# Patient Record
Sex: Female | Born: 1965 | Hispanic: Yes | Marital: Married | Smoking: Never smoker
Health system: Southern US, Community
[De-identification: ages and names within clinical notes are randomized; demographics above are authoritative.]

## PROBLEM LIST (undated history)

## (undated) DIAGNOSIS — Z923 Personal history of irradiation: Secondary | ICD-10-CM

## (undated) DIAGNOSIS — Z9221 Personal history of antineoplastic chemotherapy: Secondary | ICD-10-CM

## (undated) DIAGNOSIS — C50919 Malignant neoplasm of unspecified site of unspecified female breast: Secondary | ICD-10-CM

## (undated) DIAGNOSIS — C801 Malignant (primary) neoplasm, unspecified: Secondary | ICD-10-CM

## (undated) DIAGNOSIS — C50911 Malignant neoplasm of unspecified site of right female breast: Principal | ICD-10-CM

## (undated) DIAGNOSIS — S43005A Unspecified dislocation of left shoulder joint, initial encounter: Secondary | ICD-10-CM

## (undated) HISTORY — PX: BREAST LUMPECTOMY: SHX2

## (undated) HISTORY — DX: Malignant neoplasm of unspecified site of right female breast: C50.911

## (undated) HISTORY — DX: Malignant (primary) neoplasm, unspecified: C80.1

## (undated) HISTORY — DX: Unspecified dislocation of left shoulder joint, initial encounter: S43.005A

---

## 2008-01-08 DIAGNOSIS — Z9221 Personal history of antineoplastic chemotherapy: Secondary | ICD-10-CM

## 2008-01-08 DIAGNOSIS — Z923 Personal history of irradiation: Secondary | ICD-10-CM

## 2008-01-08 HISTORY — DX: Personal history of antineoplastic chemotherapy: Z92.21

## 2008-01-08 HISTORY — PX: BREAST SURGERY: SHX581

## 2008-01-08 HISTORY — DX: Personal history of irradiation: Z92.3

## 2008-01-15 DIAGNOSIS — C50911 Malignant neoplasm of unspecified site of right female breast: Secondary | ICD-10-CM

## 2008-01-15 HISTORY — DX: Malignant neoplasm of unspecified site of right female breast: C50.911

## 2008-06-10 ENCOUNTER — Ambulatory Visit: Payer: Self-pay | Admitting: Oncology

## 2008-06-13 LAB — CBC WITH DIFFERENTIAL/PLATELET
BASO%: 0.7 % (ref 0.0–2.0)
Basophils Absolute: 0 10*3/uL (ref 0.0–0.1)
EOS%: 4.4 % (ref 0.0–7.0)
HCT: 33.4 % — ABNORMAL LOW (ref 34.8–46.6)
HGB: 11.3 g/dL — ABNORMAL LOW (ref 11.6–15.9)
MCH: 28 pg (ref 25.1–34.0)
MCHC: 33.7 g/dL (ref 31.5–36.0)
MCV: 82.9 fL (ref 79.5–101.0)
MONO%: 6.1 % (ref 0.0–14.0)
NEUT%: 66.1 % (ref 38.4–76.8)
lymph#: 1.7 10*3/uL (ref 0.9–3.3)

## 2008-06-14 ENCOUNTER — Ambulatory Visit (HOSPITAL_COMMUNITY): Admission: RE | Admit: 2008-06-14 | Discharge: 2008-06-14 | Payer: Self-pay | Admitting: Oncology

## 2008-06-14 LAB — COMPREHENSIVE METABOLIC PANEL
Albumin: 4.4 g/dL (ref 3.5–5.2)
Alkaline Phosphatase: 59 U/L (ref 39–117)
BUN: 12 mg/dL (ref 6–23)
CO2: 23 mEq/L (ref 19–32)
Calcium: 9.5 mg/dL (ref 8.4–10.5)
Chloride: 107 mEq/L (ref 96–112)
Glucose, Bld: 100 mg/dL — ABNORMAL HIGH (ref 70–99)
Potassium: 3.8 mEq/L (ref 3.5–5.3)
Sodium: 140 mEq/L (ref 135–145)
Total Protein: 7.6 g/dL (ref 6.0–8.3)

## 2008-06-14 LAB — CANCER ANTIGEN 27.29: CA 27.29: 26 U/mL (ref 0–39)

## 2008-06-14 LAB — LACTATE DEHYDROGENASE: LDH: 138 U/L (ref 94–250)

## 2008-06-15 ENCOUNTER — Encounter: Admission: RE | Admit: 2008-06-15 | Discharge: 2008-06-15 | Payer: Self-pay | Admitting: Specialist

## 2008-06-28 LAB — CBC WITH DIFFERENTIAL/PLATELET
BASO%: 0.5 % (ref 0.0–2.0)
Basophils Absolute: 0 10*3/uL (ref 0.0–0.1)
EOS%: 4.1 % (ref 0.0–7.0)
HGB: 11.4 g/dL — ABNORMAL LOW (ref 11.6–15.9)
MCH: 29.1 pg (ref 25.1–34.0)
MCHC: 35.2 g/dL (ref 31.5–36.0)
MCV: 82.5 fL (ref 79.5–101.0)
MONO%: 8.7 % (ref 0.0–14.0)
RDW: 15.2 % — ABNORMAL HIGH (ref 11.2–14.5)

## 2008-06-29 ENCOUNTER — Ambulatory Visit (HOSPITAL_COMMUNITY): Admission: RE | Admit: 2008-06-29 | Discharge: 2008-06-29 | Payer: Self-pay | Admitting: Oncology

## 2008-07-07 LAB — CBC WITH DIFFERENTIAL/PLATELET
BASO%: 0.2 % (ref 0.0–2.0)
EOS%: 1.4 % (ref 0.0–7.0)
HCT: 36.4 % (ref 34.8–46.6)
MCH: 27.5 pg (ref 25.1–34.0)
MCHC: 33 g/dL (ref 31.5–36.0)
MONO#: 0.9 10*3/uL (ref 0.1–0.9)
NEUT%: 83.9 % — ABNORMAL HIGH (ref 38.4–76.8)
RBC: 4.36 10*6/uL (ref 3.70–5.45)
RDW: 15.2 % — ABNORMAL HIGH (ref 11.2–14.5)
WBC: 25.7 10*3/uL — ABNORMAL HIGH (ref 3.9–10.3)
lymph#: 2.8 10*3/uL (ref 0.9–3.3)

## 2008-07-07 LAB — COMPREHENSIVE METABOLIC PANEL
ALT: 49 U/L — ABNORMAL HIGH (ref 0–35)
AST: 37 U/L (ref 0–37)
CO2: 26 mEq/L (ref 19–32)
Calcium: 8.9 mg/dL (ref 8.4–10.5)
Chloride: 101 mEq/L (ref 96–112)
Creatinine, Ser: 0.54 mg/dL (ref 0.40–1.20)
Sodium: 136 mEq/L (ref 135–145)
Total Protein: 6.6 g/dL (ref 6.0–8.3)

## 2008-07-12 ENCOUNTER — Ambulatory Visit: Payer: Self-pay | Admitting: Oncology

## 2008-07-14 LAB — COMPREHENSIVE METABOLIC PANEL
AST: 57 U/L — ABNORMAL HIGH (ref 0–37)
Alkaline Phosphatase: 87 U/L (ref 39–117)
BUN: 10 mg/dL (ref 6–23)
Creatinine, Ser: 0.58 mg/dL (ref 0.40–1.20)

## 2008-07-14 LAB — CBC WITH DIFFERENTIAL/PLATELET
BASO%: 0.7 % (ref 0.0–2.0)
EOS%: 1.6 % (ref 0.0–7.0)
HCT: 31.2 % — ABNORMAL LOW (ref 34.8–46.6)
LYMPH%: 25 % (ref 14.0–49.7)
MCH: 27.4 pg (ref 25.1–34.0)
MCHC: 33.3 g/dL (ref 31.5–36.0)
NEUT%: 66.3 % (ref 38.4–76.8)
Platelets: 248 10*3/uL (ref 145–400)
RBC: 3.8 10*6/uL (ref 3.70–5.45)
lymph#: 2.1 10*3/uL (ref 0.9–3.3)
nRBC: 0 % (ref 0–0)

## 2008-07-21 LAB — CBC WITH DIFFERENTIAL/PLATELET
Basophils Absolute: 0 10*3/uL (ref 0.0–0.1)
Eosinophils Absolute: 0.1 10*3/uL (ref 0.0–0.5)
LYMPH%: 40.8 % (ref 14.0–49.7)
MCV: 84.2 fL (ref 79.5–101.0)
MONO%: 5.1 % (ref 0.0–14.0)
NEUT#: 2.5 10*3/uL (ref 1.5–6.5)
NEUT%: 51.9 % (ref 38.4–76.8)
Platelets: 309 10*3/uL (ref 145–400)
RBC: 3.8 10*6/uL (ref 3.70–5.45)

## 2008-07-21 LAB — COMPREHENSIVE METABOLIC PANEL
ALT: 65 U/L — ABNORMAL HIGH (ref 0–35)
Alkaline Phosphatase: 74 U/L (ref 39–117)
Glucose, Bld: 98 mg/dL (ref 70–99)
Sodium: 139 mEq/L (ref 135–145)
Total Bilirubin: 0.7 mg/dL (ref 0.3–1.2)
Total Protein: 7.1 g/dL (ref 6.0–8.3)

## 2008-07-28 LAB — CBC WITH DIFFERENTIAL/PLATELET
BASO%: 1 % (ref 0.0–2.0)
EOS%: 3.6 % (ref 0.0–7.0)
LYMPH%: 50.8 % — ABNORMAL HIGH (ref 14.0–49.7)
MCH: 27.9 pg (ref 25.1–34.0)
MCHC: 33 g/dL (ref 31.5–36.0)
MCV: 84.5 fL (ref 79.5–101.0)
MONO%: 5.3 % (ref 0.0–14.0)
Platelets: 484 10*3/uL — ABNORMAL HIGH (ref 145–400)
RBC: 3.8 10*6/uL (ref 3.70–5.45)
WBC: 4.2 10*3/uL (ref 3.9–10.3)
nRBC: 0 % (ref 0–0)

## 2008-08-03 LAB — COMPREHENSIVE METABOLIC PANEL
ALT: 48 U/L — ABNORMAL HIGH (ref 0–35)
AST: 35 U/L (ref 0–37)
Albumin: 4.1 g/dL (ref 3.5–5.2)
Alkaline Phosphatase: 60 U/L (ref 39–117)
Calcium: 9.2 mg/dL (ref 8.4–10.5)
Chloride: 106 mEq/L (ref 96–112)
Creatinine, Ser: 0.62 mg/dL (ref 0.40–1.20)
Potassium: 4.1 mEq/L (ref 3.5–5.3)

## 2008-08-03 LAB — CBC WITH DIFFERENTIAL/PLATELET
BASO%: 0.8 % (ref 0.0–2.0)
Basophils Absolute: 0 10e3/uL (ref 0.0–0.1)
EOS%: 0.7 % (ref 0.0–7.0)
Eosinophils Absolute: 0 10e3/uL (ref 0.0–0.5)
HCT: 31.9 % — ABNORMAL LOW (ref 34.8–46.6)
HGB: 11 g/dL — ABNORMAL LOW (ref 11.6–15.9)
LYMPH%: 41.1 % (ref 14.0–49.7)
MCH: 29.2 pg (ref 25.1–34.0)
MCHC: 34.5 g/dL (ref 31.5–36.0)
MCV: 84.7 fL (ref 79.5–101.0)
MONO#: 0.2 10e3/uL (ref 0.1–0.9)
MONO%: 4.7 % (ref 0.0–14.0)
NEUT#: 2.2 10e3/uL (ref 1.5–6.5)
NEUT%: 52.7 % (ref 38.4–76.8)
Platelets: 503 10e3/uL — ABNORMAL HIGH (ref 145–400)
RBC: 3.77 10e6/uL (ref 3.70–5.45)
RDW: 18 % — ABNORMAL HIGH (ref 11.2–14.5)
WBC: 4.3 10e3/uL (ref 3.9–10.3)
lymph#: 1.7 10e3/uL (ref 0.9–3.3)

## 2008-08-09 ENCOUNTER — Ambulatory Visit: Payer: Self-pay | Admitting: Oncology

## 2008-08-11 LAB — URINALYSIS, MICROSCOPIC - CHCC
Glucose: NEGATIVE g/dL
Nitrite: NEGATIVE
Specific Gravity, Urine: 1.025 (ref 1.003–1.035)

## 2008-08-11 LAB — COMPREHENSIVE METABOLIC PANEL
ALT: 62 U/L — ABNORMAL HIGH (ref 0–35)
Albumin: 4 g/dL (ref 3.5–5.2)
BUN: 7 mg/dL (ref 6–23)
CO2: 23 mEq/L (ref 19–32)
Calcium: 9.2 mg/dL (ref 8.4–10.5)
Chloride: 108 mEq/L (ref 96–112)
Creatinine, Ser: 0.59 mg/dL (ref 0.40–1.20)
Potassium: 3.7 mEq/L (ref 3.5–5.3)

## 2008-08-11 LAB — CBC WITH DIFFERENTIAL/PLATELET
BASO%: 0.7 % (ref 0.0–2.0)
Basophils Absolute: 0 10*3/uL (ref 0.0–0.1)
HCT: 32.4 % — ABNORMAL LOW (ref 34.8–46.6)
HGB: 10.9 g/dL — ABNORMAL LOW (ref 11.6–15.9)
MONO#: 0.4 10*3/uL (ref 0.1–0.9)
NEUT#: 2.1 10*3/uL (ref 1.5–6.5)
NEUT%: 46.4 % (ref 38.4–76.8)
WBC: 4.6 10*3/uL (ref 3.9–10.3)
lymph#: 2 10*3/uL (ref 0.9–3.3)

## 2008-08-14 ENCOUNTER — Emergency Department (HOSPITAL_COMMUNITY): Admission: EM | Admit: 2008-08-14 | Discharge: 2008-08-15 | Payer: Self-pay | Admitting: Emergency Medicine

## 2008-08-18 LAB — CBC WITH DIFFERENTIAL/PLATELET
Basophils Absolute: 0.1 10*3/uL (ref 0.0–0.1)
Eosinophils Absolute: 0.1 10*3/uL (ref 0.0–0.5)
HCT: 32 % — ABNORMAL LOW (ref 34.8–46.6)
HGB: 10.7 g/dL — ABNORMAL LOW (ref 11.6–15.9)
LYMPH%: 42.9 % (ref 14.0–49.7)
MCV: 86.3 fL (ref 79.5–101.0)
MONO#: 0.2 10*3/uL (ref 0.1–0.9)
MONO%: 5.3 % (ref 0.0–14.0)
NEUT#: 2.1 10*3/uL (ref 1.5–6.5)
NEUT%: 48.3 % (ref 38.4–76.8)
Platelets: 191 10*3/uL (ref 145–400)
WBC: 4.4 10*3/uL (ref 3.9–10.3)

## 2008-08-18 LAB — COMPREHENSIVE METABOLIC PANEL
Alkaline Phosphatase: 59 U/L (ref 39–117)
BUN: 10 mg/dL (ref 6–23)
Creatinine, Ser: 0.53 mg/dL (ref 0.40–1.20)
Glucose, Bld: 103 mg/dL — ABNORMAL HIGH (ref 70–99)
Sodium: 137 mEq/L (ref 135–145)
Total Bilirubin: 0.7 mg/dL (ref 0.3–1.2)
Total Protein: 7 g/dL (ref 6.0–8.3)

## 2008-08-25 LAB — CBC WITH DIFFERENTIAL/PLATELET
BASO%: 2.8 % — ABNORMAL HIGH (ref 0.0–2.0)
HCT: 31 % — ABNORMAL LOW (ref 34.8–46.6)
LYMPH%: 52.8 % — ABNORMAL HIGH (ref 14.0–49.7)
MCHC: 33.9 g/dL (ref 31.5–36.0)
MCV: 85.4 fL (ref 79.5–101.0)
MONO#: 0.3 10*3/uL (ref 0.1–0.9)
MONO%: 8.2 % (ref 0.0–14.0)
NEUT%: 35.3 % — ABNORMAL LOW (ref 38.4–76.8)
Platelets: 79 10*3/uL — ABNORMAL LOW (ref 145–400)
RBC: 3.63 10*6/uL — ABNORMAL LOW (ref 3.70–5.45)
nRBC: 0 % (ref 0–0)

## 2008-09-01 LAB — CBC WITH DIFFERENTIAL/PLATELET
BASO%: 0.7 % (ref 0.0–2.0)
EOS%: 2.2 % (ref 0.0–7.0)
HGB: 10.1 g/dL — ABNORMAL LOW (ref 11.6–15.9)
LYMPH%: 46 % (ref 14.0–49.7)
MCH: 29.5 pg (ref 25.1–34.0)
MCHC: 33.8 g/dL (ref 31.5–36.0)
MONO#: 0.6 10*3/uL (ref 0.1–0.9)
MONO%: 14.7 % — ABNORMAL HIGH (ref 0.0–14.0)
NEUT#: 1.5 10*3/uL (ref 1.5–6.5)
NEUT%: 36.4 % — ABNORMAL LOW (ref 38.4–76.8)
RBC: 3.42 10*6/uL — ABNORMAL LOW (ref 3.70–5.45)

## 2008-09-08 ENCOUNTER — Ambulatory Visit: Payer: Self-pay | Admitting: Oncology

## 2008-09-08 LAB — CBC WITH DIFFERENTIAL/PLATELET
BASO%: 0.5 % (ref 0.0–2.0)
HCT: 32 % — ABNORMAL LOW (ref 34.8–46.6)
LYMPH%: 40.2 % (ref 14.0–49.7)
MCHC: 33.1 g/dL (ref 31.5–36.0)
MCV: 89.4 fL (ref 79.5–101.0)
MONO#: 0.7 10*3/uL (ref 0.1–0.9)
MONO%: 17.3 % — ABNORMAL HIGH (ref 0.0–14.0)
NEUT%: 39.7 % (ref 38.4–76.8)
Platelets: 603 10*3/uL — ABNORMAL HIGH (ref 145–400)
RBC: 3.58 10*6/uL — ABNORMAL LOW (ref 3.70–5.45)
WBC: 4 10*3/uL (ref 3.9–10.3)

## 2008-09-09 LAB — COMPREHENSIVE METABOLIC PANEL
ALT: 97 U/L — ABNORMAL HIGH (ref 0–35)
AST: 69 U/L — ABNORMAL HIGH (ref 0–37)
Alkaline Phosphatase: 58 U/L (ref 39–117)
CO2: 26 mEq/L (ref 19–32)
Creatinine, Ser: 0.53 mg/dL (ref 0.40–1.20)
Sodium: 140 mEq/L (ref 135–145)
Total Bilirubin: 0.6 mg/dL (ref 0.3–1.2)
Total Protein: 7.1 g/dL (ref 6.0–8.3)

## 2008-09-15 LAB — CBC WITH DIFFERENTIAL/PLATELET
BASO%: 1.9 % (ref 0.0–2.0)
Basophils Absolute: 0.1 10*3/uL (ref 0.0–0.1)
EOS%: 1 % (ref 0.0–7.0)
HCT: 32.6 % — ABNORMAL LOW (ref 34.8–46.6)
HGB: 11.1 g/dL — ABNORMAL LOW (ref 11.6–15.9)
MCH: 30.2 pg (ref 25.1–34.0)
MCHC: 34 g/dL (ref 31.5–36.0)
MCV: 88.8 fL (ref 79.5–101.0)
MONO%: 17.1 % — ABNORMAL HIGH (ref 0.0–14.0)
NEUT%: 37.9 % — ABNORMAL LOW (ref 38.4–76.8)
RDW: 20.6 % — ABNORMAL HIGH (ref 11.2–14.5)

## 2008-09-15 LAB — COMPREHENSIVE METABOLIC PANEL
AST: 63 U/L — ABNORMAL HIGH (ref 0–37)
Alkaline Phosphatase: 79 U/L (ref 39–117)
BUN: 10 mg/dL (ref 6–23)
Creatinine, Ser: 0.49 mg/dL (ref 0.40–1.20)
Total Bilirubin: 0.5 mg/dL (ref 0.3–1.2)

## 2008-09-22 LAB — CBC WITH DIFFERENTIAL/PLATELET
Basophils Absolute: 0 10*3/uL (ref 0.0–0.1)
EOS%: 0.1 % (ref 0.0–7.0)
HCT: 31.9 % — ABNORMAL LOW (ref 34.8–46.6)
HGB: 10.9 g/dL — ABNORMAL LOW (ref 11.6–15.9)
LYMPH%: 7 % — ABNORMAL LOW (ref 14.0–49.7)
MCH: 31.2 pg (ref 25.1–34.0)
MCV: 91 fL (ref 79.5–101.0)
MONO%: 6.2 % (ref 0.0–14.0)
NEUT%: 86.6 % — ABNORMAL HIGH (ref 38.4–76.8)
Platelets: 61 10*3/uL — ABNORMAL LOW (ref 145–400)
lymph#: 2.1 10*3/uL (ref 0.9–3.3)

## 2008-09-22 LAB — COMPREHENSIVE METABOLIC PANEL
AST: 110 U/L — ABNORMAL HIGH (ref 0–37)
Alkaline Phosphatase: 244 U/L — ABNORMAL HIGH (ref 39–117)
BUN: 6 mg/dL (ref 6–23)
CO2: 26 mEq/L (ref 19–32)
Calcium: 9.3 mg/dL (ref 8.4–10.5)
Chloride: 105 mEq/L (ref 96–112)
Creatinine, Ser: 0.6 mg/dL (ref 0.40–1.20)

## 2008-09-26 ENCOUNTER — Ambulatory Visit (HOSPITAL_COMMUNITY): Admission: RE | Admit: 2008-09-26 | Discharge: 2008-09-26 | Payer: Self-pay | Admitting: Oncology

## 2008-09-27 LAB — COMPREHENSIVE METABOLIC PANEL
BUN: 8 mg/dL (ref 6–23)
CO2: 26 mEq/L (ref 19–32)
Calcium: 9.2 mg/dL (ref 8.4–10.5)
Chloride: 104 mEq/L (ref 96–112)
Creatinine, Ser: 0.76 mg/dL (ref 0.40–1.20)
Glucose, Bld: 143 mg/dL — ABNORMAL HIGH (ref 70–99)

## 2008-09-27 LAB — CBC WITH DIFFERENTIAL/PLATELET
Basophils Absolute: 0 10*3/uL (ref 0.0–0.1)
EOS%: 0.5 % (ref 0.0–7.0)
HCT: 30.3 % — ABNORMAL LOW (ref 34.8–46.6)
HGB: 10.4 g/dL — ABNORMAL LOW (ref 11.6–15.9)
MCH: 31.9 pg (ref 25.1–34.0)
MONO#: 0.8 10*3/uL (ref 0.1–0.9)
NEUT%: 77.5 % — ABNORMAL HIGH (ref 38.4–76.8)
lymph#: 1.3 10*3/uL (ref 0.9–3.3)

## 2008-10-27 ENCOUNTER — Encounter (INDEPENDENT_AMBULATORY_CARE_PROVIDER_SITE_OTHER): Payer: Self-pay | Admitting: Surgery

## 2008-10-27 ENCOUNTER — Ambulatory Visit: Payer: Self-pay | Admitting: Oncology

## 2008-10-27 ENCOUNTER — Ambulatory Visit (HOSPITAL_COMMUNITY): Admission: RE | Admit: 2008-10-27 | Discharge: 2008-10-27 | Payer: Self-pay | Admitting: Surgery

## 2008-11-15 ENCOUNTER — Ambulatory Visit: Admission: RE | Admit: 2008-11-15 | Discharge: 2009-01-06 | Payer: Self-pay | Admitting: Radiation Oncology

## 2008-11-17 LAB — CBC WITH DIFFERENTIAL/PLATELET
BASO%: 0.6 % (ref 0.0–2.0)
EOS%: 11.8 % — ABNORMAL HIGH (ref 0.0–7.0)
Eosinophils Absolute: 0.9 10*3/uL — ABNORMAL HIGH (ref 0.0–0.5)
LYMPH%: 27.4 % (ref 14.0–49.7)
MCH: 32.1 pg (ref 25.1–34.0)
MCHC: 33.7 g/dL (ref 31.5–36.0)
MCV: 95.4 fL (ref 79.5–101.0)
MONO%: 6.9 % (ref 0.0–14.0)
NEUT#: 4.2 10*3/uL (ref 1.5–6.5)
Platelets: 258 10*3/uL (ref 145–400)
RBC: 3.77 10*6/uL (ref 3.70–5.45)
RDW: 17 % — ABNORMAL HIGH (ref 11.2–14.5)

## 2008-11-17 LAB — PREGNANCY, URINE: Preg Test, Ur: NEGATIVE

## 2008-12-05 ENCOUNTER — Ambulatory Visit: Payer: Self-pay | Admitting: Oncology

## 2008-12-07 LAB — COMPREHENSIVE METABOLIC PANEL
AST: 31 U/L (ref 0–37)
Albumin: 4.1 g/dL (ref 3.5–5.2)
Alkaline Phosphatase: 83 U/L (ref 39–117)
BUN: 13 mg/dL (ref 6–23)
Calcium: 9.4 mg/dL (ref 8.4–10.5)
Chloride: 104 mEq/L (ref 96–112)
Glucose, Bld: 146 mg/dL — ABNORMAL HIGH (ref 70–99)
Potassium: 3.4 mEq/L — ABNORMAL LOW (ref 3.5–5.3)
Sodium: 139 mEq/L (ref 135–145)
Total Protein: 7.3 g/dL (ref 6.0–8.3)

## 2008-12-07 LAB — CBC WITH DIFFERENTIAL/PLATELET
Basophils Absolute: 0 10*3/uL (ref 0.0–0.1)
EOS%: 3.8 % (ref 0.0–7.0)
Eosinophils Absolute: 0.3 10*3/uL (ref 0.0–0.5)
HGB: 11.9 g/dL (ref 11.6–15.9)
MONO%: 6.2 % (ref 0.0–14.0)
NEUT#: 4.2 10*3/uL (ref 1.5–6.5)
RBC: 3.77 10*6/uL (ref 3.70–5.45)
RDW: 16.8 % — ABNORMAL HIGH (ref 11.2–14.5)
lymph#: 2.1 10*3/uL (ref 0.9–3.3)

## 2009-01-09 ENCOUNTER — Ambulatory Visit: Admission: RE | Admit: 2009-01-09 | Discharge: 2009-01-20 | Payer: Self-pay | Admitting: Radiation Oncology

## 2009-01-09 ENCOUNTER — Ambulatory Visit: Payer: Self-pay | Admitting: Oncology

## 2009-01-12 LAB — COMPREHENSIVE METABOLIC PANEL
ALT: 27 U/L (ref 0–35)
AST: 22 U/L (ref 0–37)
CO2: 22 mEq/L (ref 19–32)
Creatinine, Ser: 0.62 mg/dL (ref 0.40–1.20)
Glucose, Bld: 102 mg/dL — ABNORMAL HIGH (ref 70–99)
Potassium: 4 mEq/L (ref 3.5–5.3)
Sodium: 139 mEq/L (ref 135–145)
Total Protein: 7.1 g/dL (ref 6.0–8.3)

## 2009-01-12 LAB — CBC WITH DIFFERENTIAL/PLATELET
BASO%: 0.5 % (ref 0.0–2.0)
Basophils Absolute: 0 10*3/uL (ref 0.0–0.1)
Eosinophils Absolute: 0.3 10*3/uL (ref 0.0–0.5)
HCT: 37.3 % (ref 34.8–46.6)
HGB: 12.8 g/dL (ref 11.6–15.9)
LYMPH%: 14.4 % (ref 14.0–49.7)
MCV: 89.5 fL (ref 79.5–101.0)
RDW: 15 % — ABNORMAL HIGH (ref 11.2–14.5)

## 2009-01-12 LAB — VITAMIN D 25 HYDROXY (VIT D DEFICIENCY, FRACTURES): Vit D, 25-Hydroxy: 14 ng/mL — ABNORMAL LOW (ref 30–89)

## 2009-02-27 ENCOUNTER — Ambulatory Visit: Payer: Self-pay | Admitting: Oncology

## 2009-03-01 LAB — CBC WITH DIFFERENTIAL/PLATELET
Basophils Absolute: 0 10*3/uL (ref 0.0–0.1)
Eosinophils Absolute: 0.2 10*3/uL (ref 0.0–0.5)
HCT: 35.4 % (ref 34.8–46.6)
HGB: 12.2 g/dL (ref 11.6–15.9)
LYMPH%: 15.4 % (ref 14.0–49.7)
MONO#: 0.5 10*3/uL (ref 0.1–0.9)
NEUT#: 4.4 10*3/uL (ref 1.5–6.5)
NEUT%: 72.8 % (ref 38.4–76.8)
Platelets: 265 10*3/uL (ref 145–400)
WBC: 6.1 10*3/uL (ref 3.9–10.3)
lymph#: 0.9 10*3/uL (ref 0.9–3.3)

## 2009-03-01 LAB — COMPREHENSIVE METABOLIC PANEL
AST: 24 U/L (ref 0–37)
Albumin: 4.4 g/dL (ref 3.5–5.2)
Sodium: 140 mEq/L (ref 135–145)
Total Bilirubin: 1 mg/dL (ref 0.3–1.2)
Total Protein: 7.6 g/dL (ref 6.0–8.3)

## 2009-03-07 ENCOUNTER — Ambulatory Visit (HOSPITAL_COMMUNITY): Admission: RE | Admit: 2009-03-07 | Discharge: 2009-03-07 | Payer: Self-pay | Admitting: Oncology

## 2009-04-10 ENCOUNTER — Ambulatory Visit: Payer: Self-pay | Admitting: Oncology

## 2009-04-12 LAB — COMPREHENSIVE METABOLIC PANEL
ALT: 33 U/L (ref 0–35)
AST: 27 U/L (ref 0–37)
Albumin: 4.9 g/dL (ref 3.5–5.2)
BUN: 16 mg/dL (ref 6–23)
Creatinine, Ser: 0.71 mg/dL (ref 0.40–1.20)
Sodium: 137 mEq/L (ref 135–145)
Total Bilirubin: 1 mg/dL (ref 0.3–1.2)
Total Protein: 7.7 g/dL (ref 6.0–8.3)

## 2009-04-12 LAB — CBC WITH DIFFERENTIAL/PLATELET
Eosinophils Absolute: 0.2 10*3/uL (ref 0.0–0.5)
HCT: 39.4 % (ref 34.8–46.6)
LYMPH%: 21.2 % (ref 14.0–49.7)
MCHC: 34 g/dL (ref 31.5–36.0)
MONO%: 5.6 % (ref 0.0–14.0)
Platelets: 267 10*3/uL (ref 145–400)
RBC: 4.32 10*6/uL (ref 3.70–5.45)
RDW: 14.8 % — ABNORMAL HIGH (ref 11.2–14.5)
lymph#: 1.7 10*3/uL (ref 0.9–3.3)

## 2009-04-12 LAB — VITAMIN D 25 HYDROXY (VIT D DEFICIENCY, FRACTURES): Vit D, 25-Hydroxy: 19 ng/mL — ABNORMAL LOW (ref 30–89)

## 2009-05-03 ENCOUNTER — Ambulatory Visit (HOSPITAL_COMMUNITY): Admission: RE | Admit: 2009-05-03 | Discharge: 2009-05-03 | Payer: Self-pay | Admitting: Oncology

## 2009-05-03 LAB — COMPREHENSIVE METABOLIC PANEL
ALT: 27 U/L (ref 0–35)
BUN: 15 mg/dL (ref 6–23)
Chloride: 102 mEq/L (ref 96–112)
Potassium: 3.8 mEq/L (ref 3.5–5.3)
Sodium: 137 mEq/L (ref 135–145)
Total Bilirubin: 1.4 mg/dL — ABNORMAL HIGH (ref 0.3–1.2)
Total Protein: 7.3 g/dL (ref 6.0–8.3)

## 2009-05-03 LAB — VITAMIN D 25 HYDROXY (VIT D DEFICIENCY, FRACTURES): Vit D, 25-Hydroxy: 21 ng/mL — ABNORMAL LOW (ref 30–89)

## 2009-05-03 LAB — CBC WITH DIFFERENTIAL/PLATELET
EOS%: 2 % (ref 0.0–7.0)
HCT: 38.2 % (ref 34.8–46.6)
LYMPH%: 9.3 % — ABNORMAL LOW (ref 14.0–49.7)
MCH: 31.5 pg (ref 25.1–34.0)
MCHC: 34.7 g/dL (ref 31.5–36.0)
Platelets: 254 10*3/uL (ref 145–400)
RBC: 4.22 10*6/uL (ref 3.70–5.45)
WBC: 8 10*3/uL (ref 3.9–10.3)

## 2009-05-03 LAB — CANCER ANTIGEN 27.29: CA 27.29: 10 U/mL (ref 0–39)

## 2009-06-16 ENCOUNTER — Encounter: Admission: RE | Admit: 2009-06-16 | Discharge: 2009-06-16 | Payer: Self-pay | Admitting: Oncology

## 2009-10-13 ENCOUNTER — Ambulatory Visit: Payer: Self-pay | Admitting: Oncology

## 2009-10-17 LAB — COMPREHENSIVE METABOLIC PANEL
AST: 22 U/L (ref 0–37)
Albumin: 4.1 g/dL (ref 3.5–5.2)
Alkaline Phosphatase: 80 U/L (ref 39–117)
CO2: 32 mEq/L (ref 19–32)
Calcium: 9.1 mg/dL (ref 8.4–10.5)
Chloride: 106 mEq/L (ref 96–112)
Glucose, Bld: 104 mg/dL — ABNORMAL HIGH (ref 70–99)
Potassium: 3.5 mEq/L (ref 3.5–5.3)
Sodium: 142 mEq/L (ref 135–145)

## 2009-10-17 LAB — CBC
HCT: 36.7 % (ref 36.0–46.0)
Hemoglobin: 12.4 g/dL (ref 12.0–15.0)
MCHC: 33.7 g/dL (ref 30.0–36.0)
MCV: 89.7 fL (ref 78.0–100.0)
Platelets: 268 10*3/uL (ref 150–400)
RBC: 4.1 MIL/uL (ref 3.87–5.11)
WBC: 7.3 10*3/uL (ref 4.0–10.5)

## 2009-10-18 LAB — CANCER ANTIGEN 27.29: CA 27.29: 8 U/mL (ref 0–39)

## 2010-01-29 ENCOUNTER — Encounter: Payer: Self-pay | Admitting: Oncology

## 2010-04-12 LAB — CBC
HCT: 37.4 % (ref 36.0–46.0)
Hemoglobin: 12.7 g/dL (ref 12.0–15.0)
MCV: 97.2 fL (ref 78.0–100.0)
Platelets: 344 10*3/uL (ref 150–400)
RBC: 3.85 MIL/uL — ABNORMAL LOW (ref 3.87–5.11)
RDW: 22 % — ABNORMAL HIGH (ref 11.5–15.5)
WBC: 9.2 10*3/uL (ref 4.0–10.5)

## 2010-04-12 LAB — COMPREHENSIVE METABOLIC PANEL
ALT: 45 U/L — ABNORMAL HIGH (ref 0–35)
Alkaline Phosphatase: 103 U/L (ref 39–117)
Calcium: 9.8 mg/dL (ref 8.4–10.5)
GFR calc Af Amer: >60 mL/min (ref >60)
Glucose, Bld: 95 mg/dL (ref 70–99)
Potassium: 4.1 mEq/L (ref 3.5–5.1)
Sodium: 141 mEq/L (ref 135–145)
Total Bilirubin: 0.8 mg/dL (ref 0.3–1.2)

## 2010-04-12 LAB — DIFFERENTIAL
Basophils Relative: 1 % (ref 0–1)
Eosinophils Absolute: 0.3 10*3/uL (ref 0.0–0.7)
Eosinophils Relative: 3 % (ref 0–5)
Lymphocytes Relative: 23 % (ref 12–46)
Monocytes Relative: 9 % (ref 3–12)
Neutrophils Relative %: 64 % (ref 43–77)

## 2010-04-14 LAB — DIFFERENTIAL
Basophils Relative: 1 % (ref 0–1)
Eosinophils Absolute: 0 10*3/uL (ref 0.0–0.7)
Monocytes Absolute: 0.2 10*3/uL (ref 0.1–1.0)

## 2010-04-14 LAB — POCT I-STAT, CHEM 8
Creatinine, Ser: 0.5 mg/dL (ref 0.4–1.2)
HCT: 38 % (ref 36.0–46.0)
Hemoglobin: 12.9 g/dL (ref 12.0–15.0)
Potassium: 3.8 mEq/L (ref 3.5–5.1)
Sodium: 138 mEq/L (ref 135–145)

## 2010-04-14 LAB — CBC
HCT: 30.1 % — ABNORMAL LOW (ref 36.0–46.0)
Hemoglobin: 10.1 g/dL — ABNORMAL LOW (ref 12.0–15.0)
MCHC: 33.7 g/dL (ref 30.0–36.0)
Platelets: 229 10*3/uL (ref 150–400)

## 2010-05-23 ENCOUNTER — Other Ambulatory Visit: Payer: Self-pay | Admitting: Oncology

## 2010-05-23 DIAGNOSIS — Z9889 Other specified postprocedural states: Secondary | ICD-10-CM

## 2010-05-28 ENCOUNTER — Encounter (INDEPENDENT_AMBULATORY_CARE_PROVIDER_SITE_OTHER): Payer: Self-pay | Admitting: Surgery

## 2010-06-20 ENCOUNTER — Ambulatory Visit
Admission: RE | Admit: 2010-06-20 | Discharge: 2010-06-20 | Disposition: A | Discharge status: Home / Self Care | Payer: PRIVATE HEALTH INSURANCE | Source: Ambulatory Visit | Attending: Oncology | Admitting: Oncology

## 2010-06-20 DIAGNOSIS — Z9889 Other specified postprocedural states: Secondary | ICD-10-CM

## 2010-06-26 ENCOUNTER — Other Ambulatory Visit: Payer: Self-pay | Admitting: Oncology

## 2010-06-26 ENCOUNTER — Encounter (HOSPITAL_BASED_OUTPATIENT_CLINIC_OR_DEPARTMENT_OTHER): Payer: PRIVATE HEALTH INSURANCE | Admitting: Oncology

## 2010-06-26 DIAGNOSIS — R220 Localized swelling, mass and lump, head: Secondary | ICD-10-CM

## 2010-06-26 DIAGNOSIS — M899 Disorder of bone, unspecified: Secondary | ICD-10-CM

## 2010-06-26 DIAGNOSIS — C50319 Malignant neoplasm of lower-inner quadrant of unspecified female breast: Secondary | ICD-10-CM

## 2010-06-26 DIAGNOSIS — Z171 Estrogen receptor negative status [ER-]: Secondary | ICD-10-CM

## 2010-06-26 LAB — COMPREHENSIVE METABOLIC PANEL
Albumin: 4.5 g/dL (ref 3.5–5.2)
BUN: 15 mg/dL (ref 6–23)
CO2: 24 mEq/L (ref 19–32)
Calcium: 9 mg/dL (ref 8.4–10.5)
Chloride: 104 mEq/L (ref 96–112)
Glucose, Bld: 104 mg/dL — ABNORMAL HIGH (ref 70–99)
Potassium: 3.9 mEq/L (ref 3.5–5.3)
Sodium: 140 mEq/L (ref 135–145)
Total Protein: 7.3 g/dL (ref 6.0–8.3)

## 2010-06-26 LAB — CBC WITH DIFFERENTIAL/PLATELET
Basophils Absolute: 0 10*3/uL (ref 0.0–0.1)
Eosinophils Absolute: 0.3 10*3/uL (ref 0.0–0.5)
HGB: 12.5 g/dL (ref 11.6–15.9)
MCV: 88.7 fL (ref 79.5–101.0)
MONO#: 0.4 10*3/uL (ref 0.1–0.9)
NEUT#: 3.2 10*3/uL (ref 1.5–6.5)
RDW: 14.3 % (ref 11.2–14.5)
WBC: 5.7 10*3/uL (ref 3.9–10.3)
lymph#: 1.8 10*3/uL (ref 0.9–3.3)

## 2010-10-11 ENCOUNTER — Encounter (INDEPENDENT_AMBULATORY_CARE_PROVIDER_SITE_OTHER): Payer: Self-pay | Admitting: Surgery

## 2010-10-11 ENCOUNTER — Ambulatory Visit (INDEPENDENT_AMBULATORY_CARE_PROVIDER_SITE_OTHER): Payer: PRIVATE HEALTH INSURANCE | Admitting: Surgery

## 2010-10-11 VITALS — BP 98/64 | HR 60 | Temp 97.9°F | Resp 16 | Ht 61.0 in | Wt 150.4 lb

## 2010-10-11 DIAGNOSIS — Z853 Personal history of malignant neoplasm of breast: Secondary | ICD-10-CM

## 2010-10-11 NOTE — Progress Notes (Signed)
Subjective:     Patient ID: Karen Bullock, female   DOB: 10-06-1965, 45 y.o.   MRN: [id number]  HPI The patient returns today. She is 2 years out from a right breast lumpectomy and sentinel lymph node mapping for a stage II breast cancer. She had preop chemotherapy. She had postop radiation therapy. This was for a triple negative breast cancer   Review of Systems  Constitutional: Negative.   HENT: Negative.   Eyes: Negative.   Respiratory: Negative.   Cardiovascular: Negative.   Gastrointestinal: Negative.   Genitourinary: Negative.   Musculoskeletal: Negative.   Neurological: Negative.        Objective:   Physical Exam  Constitutional: She is oriented to person, place, and time. She appears well-developed and well-nourished.  HENT:  Head: Normocephalic and atraumatic.  Nose: Nose normal.  Eyes: Pupils are equal, round, and reactive to light.  Neck: Normal range of motion. Neck supple.  Pulmonary/Chest:       Right breast with post surgical changer.  No mass .  No axillary lymphadenopathy.  Left breast normal.  No left axillary lymphadenopathy.  Lymphadenopathy:    She has no cervical adenopathy.  Neurological: She is alert and oriented to person, place, and time.   Mammogram 05/2010   BIRADS 2    Assessment:     History of right breast cancer stage 2 triple negative status post lumpectomy and SLN mapping.      Plan:     Follow up 1 year.  Continue mammograms.  Continue self exams.

## 2010-10-11 NOTE — Patient Instructions (Signed)
Follow up 1 year.  Continue follow up with oncology and mammograms.

## 2010-10-16 ENCOUNTER — Encounter (INDEPENDENT_AMBULATORY_CARE_PROVIDER_SITE_OTHER): Payer: Self-pay | Admitting: Surgery

## 2010-12-08 ENCOUNTER — Telehealth: Payer: Self-pay | Admitting: Oncology

## 2010-12-08 NOTE — Telephone Encounter (Signed)
per pof 06/19 scheduled pt for appt in jan2013.  called caremanagement lmovm of appt and to have a spanish interpreter for pt and have the interpreter to call pt to see if the d/t is good for her schedule

## 2011-01-07 ENCOUNTER — Other Ambulatory Visit: Payer: Self-pay | Admitting: Physician Assistant

## 2011-01-15 ENCOUNTER — Other Ambulatory Visit: Payer: Self-pay | Admitting: Oncology

## 2011-01-15 ENCOUNTER — Telehealth: Payer: Self-pay | Admitting: Oncology

## 2011-01-15 ENCOUNTER — Encounter: Payer: Self-pay | Admitting: Physician Assistant

## 2011-01-15 ENCOUNTER — Ambulatory Visit (HOSPITAL_BASED_OUTPATIENT_CLINIC_OR_DEPARTMENT_OTHER): Payer: PRIVATE HEALTH INSURANCE | Admitting: Physician Assistant

## 2011-01-15 ENCOUNTER — Other Ambulatory Visit: Payer: PRIVATE HEALTH INSURANCE | Admitting: Lab

## 2011-01-15 VITALS — BP 107/73 | HR 71 | Temp 97.9°F | Ht 61.0 in | Wt 153.8 lb

## 2011-01-15 DIAGNOSIS — C50911 Malignant neoplasm of unspecified site of right female breast: Secondary | ICD-10-CM

## 2011-01-15 DIAGNOSIS — M899 Disorder of bone, unspecified: Secondary | ICD-10-CM

## 2011-01-15 DIAGNOSIS — R220 Localized swelling, mass and lump, head: Secondary | ICD-10-CM

## 2011-01-15 DIAGNOSIS — C50319 Malignant neoplasm of lower-inner quadrant of unspecified female breast: Secondary | ICD-10-CM

## 2011-01-15 LAB — CBC WITH DIFFERENTIAL/PLATELET
Basophils Absolute: 0 10*3/uL (ref 0.0–0.1)
EOS%: 3.1 % (ref 0.0–7.0)
HCT: 35.2 % (ref 34.8–46.6)
HGB: 12 g/dL (ref 11.6–15.9)
MONO#: 0.6 10*3/uL (ref 0.1–0.9)
NEUT#: 4.9 10*3/uL (ref 1.5–6.5)
NEUT%: 61.9 % (ref 38.4–76.8)
RDW: 14.3 % (ref 11.2–14.5)
WBC: 8 10*3/uL (ref 3.9–10.3)
lymph#: 2.2 10*3/uL (ref 0.9–3.3)

## 2011-01-15 LAB — COMPREHENSIVE METABOLIC PANEL
ALT: 22 U/L (ref 0–35)
AST: 18 U/L (ref 0–37)
Albumin: 3.9 g/dL (ref 3.5–5.2)
BUN: 13 mg/dL (ref 6–23)
CO2: 26 mEq/L (ref 19–32)
Calcium: 9.6 mg/dL (ref 8.4–10.5)
Chloride: 102 mEq/L (ref 96–112)
Creatinine, Ser: 0.64 mg/dL (ref 0.50–1.10)
Potassium: 3.8 mEq/L (ref 3.5–5.3)

## 2011-01-15 NOTE — Telephone Encounter (Signed)
Gv pt appt for sept-oct2013.  scheduled pt for mammogram in June2013 @ [facility].  scheduled pt for appt weith Dr. Ellin Mayhew for 02/01/2011 @ 2pm.  pt has interperter for all appts

## 2011-01-15 NOTE — Progress Notes (Signed)
Hematology and Oncology Follow Up Visit  Karen Bullock [id number] 1965-04-04 46 y.o. 01/15/2011    HPI: Karen Bullock noted a tender mass in her right breast in March. She thought this would go away on its own, but as it did not, she eventually brought it to the attention of Dr. Mayford Knife in [facility], and he treated her initially with Augmentin and ibuprofen, but with no shrinkage of the mass, referred her to [facility] where on 06/02/2008 the patient underwent ultrasound-guided biopsy and clip placement under Jeralyn Ruths.  The pathology from that procedure ([id number] and [id number]) showed a high-grade invasive ductal carcinoma, which was negative for both the estrogen and progesterone receptors at 0%, with a very elevated MIB-1 at 86% and negative for HER2-neu by CISH with a ratio of 0.97.    Karen Bullock was treated neoadjuvantly with carboplatin and docetaxel x1, with a reaction to the docetaxel; then Abraxane and carboplatin, which had to be discontinued because of peripheral neuropathy, now largely resolved; finally carboplatin and Gemzar.  All chemotherapy completed in September 2010, followed by right lumpectomy and sentinel lymph node sampling October 2010 with a complete pathologic response.  She completed radiation treatments January 2011.  Now followed with observation alone.    Interim History:   Karen Bullock returns today accompanied by her Spanish interpreter for routine followup of her right breast carcinoma. She is doing well. Interim history is unremarkable. She continues to work at a [employer] which she enjoys.  The only complaint Karen Bullock has today is some occasional postsurgical pain and tightness in the right breast and right axilla. She admits that she is not up-to-date with her health maintenance. She does need a routine physical as well as a Pap smear and would like a referral accordingly.  A detailed review of systems is otherwise noncontributory as noted  below.  Review of Systems: Constitutional:  no weight loss, fever, night sweats and feels well Eyes: negative [id number] Cardiovascular: no chest pain or dyspnea on exertion Respiratory: no cough, shortness of breath, or wheezing Neurological: no TIA or stroke symptoms negative Dermatological: negative Gastrointestinal: no abdominal pain, change in bowel habits, or black or bloody stools Genito-Urinary: no dysuria, trouble voiding, or hematuria Hematological and Lymphatic: negative Breast: positive for - mild occasional breast pain, right Musculoskeletal: negative Remaining ROS negative.  FAMILY HISTORY:  The patient's father is alive at age 91, the patient's mother is alive at age 70.  The patient has 2 brothers and 2 sisters.  There is no other breast or ovarian cancer in the family to her knowledge.  GYNECOLOGIC HISTORY:  She is GX, P3, first pregnancy age 56.  Last menstrual period started on 06/08/2008.  Her periods usually last about 3 days, of which only the first day is heavy.  Sometimes her periods have been up to a week late.  She has not experienced any hot flashes so far.  She uses condoms for contraception.  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  Her husband, Reeva Davern, works in Copywriter, advertising in a [employer].  They have been married 25 years.  Their son, [name], 27, lives in [place] and has 2 children.  Their daughter, [name], 54, lives here in town with the patient as does their son, Antony Odea, 5.  The patient is a Air traffic controller.  She is originally from the [place] area of [place].  The patient's father is alive at age 62, the patient's mother is alive at age 37.  The patient has 2 brothers and 2 sisters.  There is no other breast or ovarian cancer in the family to her knowledge.   Medications:   I have reviewed the patient's current medications.  Current Outpatient Prescriptions  Medication Sig Dispense Refill  . acetaminophen (TYLENOL) 325  MG tablet Take 650 mg by mouth every 6 (six) hours as needed.        Marland Kitchen ibuprofen (ADVIL,MOTRIN) 200 MG tablet Take 200 mg by mouth every 6 (six) hours as needed.         Allergies: No Known Allergies  Physical Exam: Filed Vitals:   01/15/11 1532  BP: 107/73  Pulse: 71  Temp: 97.9 F (36.6 C)   HEENT:  Sclerae anicteric, conjunctivae pink.  Oropharynx clear.  No mucositis or candidiasis.   Nodes:  No cervical, supraclavicular, or axillary lymphadenopathy palpated.  Breast Exam:  Right breast, status post lumpectomy, well-healed incision. No specialist nodularity, skin changes, or evidence of local recurrence. Left breast is benign, no masses, discharge, skin changes, or nipple inversion.   Lungs:  Clear to auscultation bilaterally.  No crackles, rhonchi, or wheezes.   Heart:  Regular rate and rhythm.   Abdomen:  Soft, nontender.  Positive bowel sounds.  No organomegaly or masses palpated.   Musculoskeletal:  No focal spinal tenderness to palpation.  Extremities:  Benign.  No peripheral edema or cyanosis.   Skin:  Benign.   Neuro:  Nonfocal.   Lab Results: Lab Results  Component Value Date   WBC 8.0 01/15/2011   HGB 12.0 01/15/2011   HCT 35.2 01/15/2011   MCV 88.7 01/15/2011   PLT 263 01/15/2011   NEUTROABS 4.9 01/15/2011     Chemistry      Component Value Date/Time   NA 140 06/26/2010 1105   NA 140 06/26/2010 1105   K 3.9 06/26/2010 1105   K 3.9 06/26/2010 1105   CL 104 06/26/2010 1105   CL 104 06/26/2010 1105   CO2 24 06/26/2010 1105   CO2 24 06/26/2010 1105   BUN 15 06/26/2010 1105   BUN 15 06/26/2010 1105   CREATININE 0.65 06/26/2010 1105   CREATININE 0.65 06/26/2010 1105      Component Value Date/Time   CALCIUM 9.0 06/26/2010 1105   CALCIUM 9.0 06/26/2010 1105   ALKPHOS 78 06/26/2010 1105   ALKPHOS 78 06/26/2010 1105   AST 20 06/26/2010 1105   AST 20 06/26/2010 1105   ALT 22 06/26/2010 1105   ALT 22 06/26/2010 1105   BILITOT 1.0 06/26/2010 1105   BILITOT 1.0 06/26/2010 1105         Radiological Studies:  06/20/2010 DIGITAL DIAGNOSTIC BILATERAL MAMMOGRAM WITH CAD  Comparison: 06/16/2009 a group of where of the forearm and  06/02/2008  Findings: Exam demonstrates heterogeneously dense fibroglandular  tissue. There are stable post lumpectomy changes of the inner  lower right breast. Remainder of the exam is unchanged.  Mammographic images were processed with CAD.  IMPRESSION:  Stable post lumpectomy changes of the right breast.  Recommendations: Recommend continued annual bilateral diagnostic  mammographic evaluation.  BI-RADS CATEGORY 2: Benign finding(s).   Assessment:  46 year old Spanish speaker status post right breast biopsy May 2010 for a triple-negative, high-grade invasive ductal carcinoma with a very elevated MIB-1, treated neoadjuvantly with carboplatin and docetaxel x1, with a reaction to the docetaxel; then Abraxane and carboplatin, which had to be discontinued because of peripheral neuropathy, now largely resolved; finally carboplatin and Gemzar.  All chemotherapy completed in September 2010,  followed by right lumpectomy and sentinel lymph node sampling October 2010 with a complete pathologic response.  She completed radiation treatments January 2011.     Plan:  Gerica is doing very well, no clinical evidence of disease recurrence. I will note that she was previously complaining of numbness and tingling was evaluated here last June, but fortunately that has completely resolved.  We will continue to follow her on a regular basis. She scheduled for her next mammogram in June, and return per Dr. Darnelle Catalan plan to see him in October 2013.  This plan was reviewed with the patient, who voices understanding and agreement.  She knows to call with any changes or problems.    Dalylah Ramey, PA-C 01/15/2011

## 2011-02-01 ENCOUNTER — Ambulatory Visit: Payer: PRIVATE HEALTH INSURANCE | Admitting: Family Medicine

## 2011-02-01 ENCOUNTER — Telehealth: Payer: Self-pay | Admitting: *Deleted

## 2011-02-01 NOTE — Telephone Encounter (Signed)
Patient has an appt today.  Will need to reschedule appt due to inclement weather.  Left message to call our office back.  Richardson, Jeannette Ann, RN  

## 2011-02-14 ENCOUNTER — Ambulatory Visit: Payer: PRIVATE HEALTH INSURANCE | Admitting: Family Medicine

## 2011-03-06 ENCOUNTER — Ambulatory Visit: Payer: PRIVATE HEALTH INSURANCE | Admitting: Family Medicine

## 2011-03-22 ENCOUNTER — Encounter: Payer: Self-pay | Admitting: *Deleted

## 2011-04-04 ENCOUNTER — Other Ambulatory Visit (HOSPITAL_COMMUNITY)
Admission: RE | Admit: 2011-04-04 | Discharge: 2011-04-04 | Disposition: A | Discharge status: Home / Self Care | Payer: PRIVATE HEALTH INSURANCE | Source: Ambulatory Visit | Attending: Family Medicine | Admitting: Family Medicine

## 2011-04-04 ENCOUNTER — Ambulatory Visit (INDEPENDENT_AMBULATORY_CARE_PROVIDER_SITE_OTHER): Payer: PRIVATE HEALTH INSURANCE | Admitting: Family Medicine

## 2011-04-04 ENCOUNTER — Encounter: Payer: Self-pay | Admitting: Family Medicine

## 2011-04-04 VITALS — BP 98/67 | HR 59 | Temp 97.7°F | Ht 61.0 in | Wt 152.0 lb

## 2011-04-04 DIAGNOSIS — Z01419 Encounter for gynecological examination (general) (routine) without abnormal findings: Secondary | ICD-10-CM | POA: Insufficient documentation

## 2011-04-04 DIAGNOSIS — Z23 Encounter for immunization: Secondary | ICD-10-CM

## 2011-04-04 DIAGNOSIS — Z113 Encounter for screening for infections with a predominantly sexual mode of transmission: Secondary | ICD-10-CM | POA: Insufficient documentation

## 2011-04-04 DIAGNOSIS — Z124 Encounter for screening for malignant neoplasm of cervix: Secondary | ICD-10-CM

## 2011-04-04 DIAGNOSIS — Z299 Encounter for prophylactic measures, unspecified: Secondary | ICD-10-CM | POA: Insufficient documentation

## 2011-04-04 DIAGNOSIS — Z202 Contact with and (suspected) exposure to infections with a predominantly sexual mode of transmission: Secondary | ICD-10-CM

## 2011-04-04 DIAGNOSIS — R5383 Other fatigue: Secondary | ICD-10-CM | POA: Insufficient documentation

## 2011-04-04 NOTE — Assessment & Plan Note (Addendum)
Pap done today. Pt to return fasting for lipid panel.  Advised to take calcium, Vit D.  Tdap today

## 2011-04-04 NOTE — Progress Notes (Signed)
  Subjective:    Patient ID: Karen Bullock, female    DOB: 1965/11/13, 46 y.o.   MRN: [id number]  HPI 46 yo here to establish care.  Her only concern is fatigue.  SHe sleeps 7-8 hours a night.  She recently completed treatment for breast cancer. SHe is due for a pap.  PMH/PSH/FH/SH/meds and allergies were updated.  Interview done with assistance of Spanish interpreter.      Review of Systems  No fevers, weight changes, SOB, CP, urinary c/o, depressed mood or dysparunia.  LMP 09/07/11.  +hot flushes.       Objective:   Physical Exam  Nursing note and vitals reviewed. Constitutional: She appears well-developed and well-nourished. No distress.  HENT:  Head: Normocephalic and atraumatic.  Right Ear: External ear normal.  Left Ear: External ear normal.  Nose: Nose normal.  Mouth/Throat: Oropharynx is clear and moist. No oropharyngeal exudate.  Eyes: Conjunctivae are normal. Right eye exhibits no discharge. Left eye exhibits no discharge. No scleral icterus.  Neck: Normal range of motion. Neck supple. No tracheal deviation present. No thyromegaly present.  Cardiovascular: Normal rate, regular rhythm and normal heart sounds.  Exam reveals no gallop and no friction rub.   No murmur heard. Pulmonary/Chest: Effort normal and breath sounds normal. No respiratory distress. She has no wheezes. She has no rales.  Abdominal: Soft. Bowel sounds are normal. She exhibits no distension and no mass. There is no tenderness. There is no rebound and no guarding.  Genitourinary:       Nl external female.  Nl vagina without atrophic changes.  Nl cervix with ectropion.  NO other lesions.  No vaginal or cervical discharge.  Uterus small, NT.  No adnexal mass or TTP.  Musculoskeletal: Normal range of motion. She exhibits no edema.  Lymphadenopathy:    She has no cervical adenopathy.  Neurological: She is alert. No cranial nerve deficit. She exhibits normal muscle tone.       Strength 5/5  Skin: She is not  diaphoretic.  Psychiatric: She has a normal mood and affect. Her behavior is normal. Judgment and thought content normal.          Assessment & Plan:

## 2011-04-04 NOTE — Patient Instructions (Signed)
It was nice to meet you today. Please let us know if there is anything you need from [place]. Please make an appointment some morning for a lab visit so we can check your cholesterol.  You should not have anything to eat or drink other than water or black coffee for 8 hours before that visit. I would suggest that you be sure to get enough calcium and vitamin D.  You should be sure to get 1200 mg of calcium a day, and 800 mg of vitamin D a day.  This is important for your bones. Fue Optometrist.Por favor, hganos saber si hay algo que necesita de nosotros.Por favor haga una cita una maana para una visita de laboratorio para que podamos comprobar sus niveles de colesterol. Usted no debera tener nada que comer ni beber ms que agua o caf negro durante 8 horas antes de la visita.Me permito sugerir que usted estar seguro de obtener suficiente calcio y vitamina D. Usted debe estar seguro de obtener 1200 mg de calcio al da, y 800 mg de vitamina D al da. Esto es importante para los huesos.

## 2011-04-04 NOTE — Assessment & Plan Note (Signed)
Pt reports adequate sleep.  Most recent CBC reviewed and no evidence of anemia.  Will check TSH

## 2011-04-08 ENCOUNTER — Encounter: Payer: Self-pay | Admitting: Family Medicine

## 2011-04-09 ENCOUNTER — Other Ambulatory Visit: Payer: PRIVATE HEALTH INSURANCE

## 2011-04-09 ENCOUNTER — Telehealth: Payer: Self-pay | Admitting: Family Medicine

## 2011-04-09 DIAGNOSIS — R5383 Other fatigue: Secondary | ICD-10-CM

## 2011-04-09 DIAGNOSIS — Z299 Encounter for prophylactic measures, unspecified: Secondary | ICD-10-CM

## 2011-04-09 LAB — LIPID PANEL
Cholesterol: 201 mg/dL — ABNORMAL HIGH (ref 0–200)
HDL: 37 mg/dL — ABNORMAL LOW (ref >39)

## 2011-04-09 LAB — TSH: TSH: 1.33 u[IU]/mL (ref 0.350–4.500)

## 2011-04-09 NOTE — Telephone Encounter (Signed)
Pt will like to know pap result. [place]

## 2011-04-09 NOTE — Progress Notes (Signed)
FLP AND TSH DONE TODAY Vearl Allbaugh 

## 2011-04-09 NOTE — Telephone Encounter (Signed)
It was normal.  She will get a letter with the results - it may take a few days because I just sent it yesterday.  She should feel free to call with any questions.

## 2011-04-10 ENCOUNTER — Telehealth: Payer: Self-pay | Admitting: Family Medicine

## 2011-04-10 NOTE — Telephone Encounter (Signed)
Thank You ° °Marines °

## 2011-04-16 ENCOUNTER — Encounter: Payer: Self-pay | Admitting: Family Medicine

## 2011-06-24 ENCOUNTER — Ambulatory Visit
Admission: RE | Admit: 2011-06-24 | Discharge: 2011-06-24 | Disposition: A | Discharge status: Home / Self Care | Payer: PRIVATE HEALTH INSURANCE | Source: Ambulatory Visit | Attending: Physician Assistant | Admitting: Physician Assistant

## 2011-06-24 DIAGNOSIS — C50911 Malignant neoplasm of unspecified site of right female breast: Secondary | ICD-10-CM

## 2011-06-24 NOTE — Progress Notes (Signed)
Interpreter Jheri Mitter Namihira for Mammogram  

## 2011-09-23 ENCOUNTER — Ambulatory Visit (INDEPENDENT_AMBULATORY_CARE_PROVIDER_SITE_OTHER): Payer: PRIVATE HEALTH INSURANCE | Admitting: Family Medicine

## 2011-09-23 ENCOUNTER — Encounter: Payer: Self-pay | Admitting: Family Medicine

## 2011-09-23 VITALS — BP 113/63 | HR 56 | Temp 97.9°F | Ht 65.0 in | Wt 152.3 lb

## 2011-09-23 DIAGNOSIS — L298 Other pruritus: Secondary | ICD-10-CM | POA: Insufficient documentation

## 2011-09-23 LAB — CBC WITH DIFFERENTIAL/PLATELET
HCT: 37.8 % (ref 36.0–46.0)
Hemoglobin: 12.7 g/dL (ref 12.0–15.0)
Lymphocytes Relative: 30 % (ref 12–46)
MCHC: 33.6 g/dL (ref 30.0–36.0)
MCV: 86.1 fL (ref 78.0–100.0)
Monocytes Absolute: 0.6 10*3/uL (ref 0.1–1.0)
Monocytes Relative: 8 % (ref 3–12)
Neutro Abs: 3.9 10*3/uL (ref 1.7–7.7)
WBC: 7.2 10*3/uL (ref 4.0–10.5)

## 2011-09-23 LAB — POCT SKIN KOH: Skin KOH, POC: NEGATIVE

## 2011-09-23 LAB — BASIC METABOLIC PANEL
BUN: 14 mg/dL (ref 6–23)
Chloride: 106 mEq/L (ref 96–112)
Glucose, Bld: 94 mg/dL (ref 70–99)
Potassium: 4.7 mEq/L (ref 3.5–5.3)

## 2011-09-23 LAB — POCT SEDIMENTATION RATE: POCT SED RATE: 20 mm/hr (ref 0–22)

## 2011-09-23 MED ORDER — HYDROCORTISONE 2.5 % EX OINT: TOPICAL_OINTMENT | Freq: Two times a day (BID) | CUTANEOUS | Status: AC

## 2011-09-23 NOTE — Progress Notes (Signed)
  Subjective:    Patient ID: Karen Bullock, female    DOB: [birth year], 46 y.o.   MRN: [id number]  HPI  1. Pruritic rash. Patient noted several small red, itchy bumps on her legs and arms. The first lesion started 10 days ago, about the size of a pencil-eraser then spread on the right shin to a flat, red, very itchy patch. Became centrally ulcerated and had some weeping few days ago. Now has a similar area on left inner shin. Has small papules on right bicep area. Tried an OTC topical medication for poison ivy, that made it worse. Denies any pain, bleeding, malaise, fever, fatigue, weight loss, sick contacts, insects, pets, tick exposure, new medication or lotions. She did do some yard work last weekend.   History of triple negative breast cancer underwent chemo and mastectomy 2010-11. She has f/u scheduled with oncologist next month.   Review of Systems See HPI otherwise negative.  reports that she has never smoked. She does not have any smokeless tobacco history on file.     Objective:   Physical Exam  Vitals reviewed. Constitutional: She is oriented to person, place, and time. She appears well-developed and well-nourished. No distress.  HENT:  Head: Normocephalic and atraumatic.  Eyes: EOM are normal.  Pulmonary/Chest: Effort normal.  Musculoskeletal: She exhibits no edema and no tenderness.  Neurological: She is alert and oriented to person, place, and time.  Skin: Rash noted. There is erythema.       Right anterior shin has erythematous macule/patch about 3.5 cm circumference, slight raised borders. Central scabbing noted about 1cm in circumference. No ulcer, weeping, abscess. Has a similar area on left lower leg above the medial malleolus that is smaller about 2 cm.  Right arm has few small erythematous papules on bicep area without any irritation noted. No vesicles/ulcers/tenderness.  Psychiatric: She has a normal mood and affect.       Assessment & Plan:

## 2011-09-23 NOTE — Assessment & Plan Note (Signed)
Atypical rash, spreading over 10 days. Perhaps is a contact dermatitis, but appearance is atypical possibly granuloma annulare vs fungal vs less likely malignancy. Biopsy is indicated given her history of breast cancer and uncertain diagnosis. Negative KOH in the office. Check CBC w/diff, ESR, BMET. Will treat empirically with topical hydrocortisone pending the result.     Punch Biopsy Procedure Note  Pre-operative Diagnosis: Suspicious lesion Post-operative Diagnosis: same Locations: right lower leg Indications: rule out malignancy Anesthesia: Lidocaine 1% with epinephrine  Procedure Details  History of allergy to iodine: no Patient informed of the risks (including bleeding and infection) and benefits of the  procedure and Written informed consent obtained.  The lesion and surrounding area was given a sterile prep using betadyne and draped in the usual sterile fashion. The skin was then stretched perpendicular to the skin tension lines and the lesion removed using the 4 mm punch. The resulting ellipse was then closed. Antibiotic ointment and a sterile dressing applied. The specimen was sent for pathologic examination. The patient tolerated the procedure well.  EBL: 0 ml Condition: Stable Complications: none, hemostasis with al cl. Plan: 1. Instructed to keep the wound dry and covered for 24-48h and clean thereafter. 2. Warning signs of infection were reviewed.   3. Recommended that the patient use OTC analgesics as needed for pain.

## 2011-09-23 NOTE — Patient Instructions (Addendum)
Nice to meet you. Pick up steroid ointment for now. Will call with results of biopsy.  Biopsia - Cuidados posteriores  (Biopsy, Care After)  Siga estas instrucciones durante las prximas semanas. Estas indicaciones le proporcionan informacin general acerca de cmo deber cuidarse despus del procedimiento. El mdico tambin podr darle instrucciones especficas. El tratamiento ha sido planificado segn las prcticas mdicas actuales, pero en algunos casos pueden ocurrir problemas. Comunquese con el mdico si tiene algn problema o tiene preguntas despus del procedimiento. Si le han realizado una biopsia con aguja fina, podrn sentir dolor en el sitio durante 1  2 das. Si le han realizado una biopsia abierta, podrn sentir dolor en el sitio durante 3  4 das.  INSTRUCCIONES PARA EL CUIDADO EN EL HOGAR   Puede reanudar su dieta y sus actividades normales segn se le haya indicado.   Cambie el vendaje tal como se le indic. Si la herida fue cerrada con pegamento para la piel Turner Daniels), comenzar a desprenderse luego de [address].   Solo tome medicamentos que se pueden comprar sin receta o recetados para Chief Technology Officer, Dentist o fiebre, como le indica el mdico.   Consulte a su mdico cundo puede baarse y English as a second language teacher la herida.  SOLICITE ATENCIN MDICA DE INMEDIATO SI:   Aumenta el sangrado (ms de una pequea mancha) en el lugar de la biopsia.   Presenta enrojecimiento, hinchazn o aumento del dolor en la herida.   Tiene pus en el sitio de la biopsia.   Tiene fiebre.   Advierte un olor ftido que proviene del sitio de la biopsia o del vendaje.   Presenta una erupcin cutnea, siente dificultad para respirar o tiene algn otro problema de alergia.  ASEGRESE DE QUE:   Comprende estas instrucciones.   Controlar su enfermedad.   Solicitar ayuda de inmediato si no mejora o si empeora.  Document Released: 08/28/2010 Document Revised: 12/13/2010 [facility] Patient Information 2012  [place], [place].

## 2011-10-01 ENCOUNTER — Other Ambulatory Visit (HOSPITAL_BASED_OUTPATIENT_CLINIC_OR_DEPARTMENT_OTHER): Payer: PRIVATE HEALTH INSURANCE | Admitting: Lab

## 2011-10-01 DIAGNOSIS — C50919 Malignant neoplasm of unspecified site of unspecified female breast: Secondary | ICD-10-CM

## 2011-10-01 DIAGNOSIS — C50911 Malignant neoplasm of unspecified site of right female breast: Secondary | ICD-10-CM

## 2011-10-01 LAB — COMPREHENSIVE METABOLIC PANEL (CC13)
Albumin: 3.8 g/dL (ref 3.5–5.0)
Alkaline Phosphatase: 76 U/L (ref 40–150)
CO2: 27 mEq/L (ref 22–29)
Glucose: 96 mg/dl (ref 70–99)
Potassium: 4.5 mEq/L (ref 3.5–5.1)
Sodium: 143 mEq/L (ref 136–145)
Total Protein: 7 g/dL (ref 6.4–8.3)

## 2011-10-01 LAB — CBC WITH DIFFERENTIAL/PLATELET
Basophils Absolute: 0.1 10*3/uL (ref 0.0–0.1)
EOS%: 6.1 % (ref 0.0–7.0)
Eosinophils Absolute: 0.4 10*3/uL (ref 0.0–0.5)
HGB: 11.6 g/dL (ref 11.6–15.9)
MONO#: 0.5 10*3/uL (ref 0.1–0.9)
NEUT#: 3.9 10*3/uL (ref 1.5–6.5)
RDW: 14.6 % — ABNORMAL HIGH (ref 11.2–14.5)
WBC: 7.1 10*3/uL (ref 3.9–10.3)
lymph#: 2.2 10*3/uL (ref 0.9–3.3)

## 2011-10-08 ENCOUNTER — Ambulatory Visit (HOSPITAL_BASED_OUTPATIENT_CLINIC_OR_DEPARTMENT_OTHER): Payer: PRIVATE HEALTH INSURANCE | Admitting: Oncology

## 2011-10-08 VITALS — BP 101/67 | HR 76 | Temp 98.5°F | Resp 20 | Ht 65.0 in | Wt 151.5 lb

## 2011-10-08 DIAGNOSIS — C50911 Malignant neoplasm of unspecified site of right female breast: Secondary | ICD-10-CM

## 2011-10-08 DIAGNOSIS — C50919 Malignant neoplasm of unspecified site of unspecified female breast: Secondary | ICD-10-CM

## 2011-10-08 DIAGNOSIS — Z171 Estrogen receptor negative status [ER-]: Secondary | ICD-10-CM

## 2011-10-08 NOTE — Progress Notes (Signed)
ID: Karen Bullock   DOB: [birth year]  MR#: [id number]  CSN#:[id number]  PCP: Gaspar Bidding, DO GYN:  SU: Harriette Bouillon MD OTHER MD:   HISTORY OF PRESENT ILLNESS: The patient tells me she noted a tender mass in her right breast in March. She thought this would go away on its own, but as it did not, she eventually brought it to the attention of Dr. Mayford Knife in [facility], and he treated her initially with Augmentin and ibuprofen, but with no shrinkage of the mass, referred her to [facility] where on 06/02/2008 the patient underwent ultrasound-guided biopsy and clip placement under Jeralyn Ruths.  The pathology from that procedure ([id number] and [id number]) showed a high-grade invasive ductal carcinoma, which was negative for both the estrogen and progesterone receptors at 0%, with a very elevated MIB-1 at 86% and negative for HER2-neu by CISH with a ratio of 0.97. Her subsequent history is as detailed below.  INTERVAL HISTORY: Karen Bullock returns with her sister for follow up of her breast cancer. The interval history is unremarkable. She continues to work in the [place]. Family is doing fine   REVIEW OF SYSTEMS: She had a rash develop in her right lower leg, which was biopsied. She was told it was an allergy. She treated with hydrocortisone cream and that has resolved. She is having some dental pain in her right jaw and she has scheduled appointment with a dentist to evaluate that. She was having more hot flashes than usual about 3 months ago but for some reason that has resolved otherwise a detailed review of systems is noncontributory  PAST MEDICAL HISTORY: Past Medical History  Diagnosis Date  . Breast cancer, right breast 01/15/2011  . Cancer     PAST SURGICAL HISTORY: Past Surgical History  Procedure Date  . Breast surgery 2010    lumpectomy    FAMILY HISTORY Family History  Problem Relation Age of Onset  . Diabetes Father   . Cancer Neg Hx   . Hypertension Neg  Hx   . Heart disease Neg Hx   The patient's father is alive at age 11, the patient's mother is alive at age 19.  The patient has 2 brothers and 2 sisters.  There is no other breast or ovarian cancer in the family to her knowledge.  GYNECOLOGIC HISTORY: She is GX, P3, first pregnancy age 41.  Her periods pretty much stopped with chemotherapy, although she had one July of 2013. The one before that was July of 2012.  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  Her husband, Janki Dike, works in Copywriter, advertising in a [employer].  They have been married 25+ years.  Their son, [name], lives in [place] and has 2 children.  Their daughter, [name], lives here in town with the patient as does her son, Lucio,7.  The patient is a Air traffic controller.  She is originally from the [place] area of [place].   ADVANCED DIRECTIVES:  HEALTH MAINTENANCE: History  Substance Use Topics  . Smoking status: Never Smoker   . Smokeless tobacco: Not on file  . Alcohol Use: No     Colonoscopy:  PAP:  Bone density:  Lipid panel:  No Known Allergies  Current Outpatient Prescriptions  Medication Sig Dispense Refill  . acetaminophen (TYLENOL) 325 MG tablet Take 650 mg by mouth every 6 (six) hours as needed.        . hydrocortisone 2.5 % ointment Apply topically 2 (two) times daily.  30  g  0  . ibuprofen (ADVIL,MOTRIN) 200 MG tablet Take 200 mg by mouth every 6 (six) hours as needed.         OBJECTIVE: Middle-aged Latin American woman who appears well Filed Vitals:   10/08/11 1351  BP: 101/67  Pulse: 76  Temp: 98.5 F (36.9 C)  Resp: 20     Body mass index is 25.21 kg/(m^2).    ECOG FS: 0  Sclerae unicteric Oropharynx clear No cervical or supraclavicular adenopathy Lungs no rales or rhonchi Heart regular rate and rhythm Abd benign MSK no focal spinal tenderness, no peripheral edema Neuro: nonfocal Breasts: The right breast is status post lumpectomy and radiation. There is no evidence  of local recurrence. The right axilla is benign. The left breast is unremarkable.  LAB RESULTS: Lab Results  Component Value Date   WBC 7.1 10/01/2011   NEUTROABS 3.9 10/01/2011   HGB 11.6 10/01/2011   HCT 34.1* 10/01/2011   MCV 88.6 10/01/2011   PLT 237 10/01/2011      Chemistry      Component Value Date/Time   NA 143 10/01/2011 1322   NA 141 09/23/2011 0935   K 4.5 10/01/2011 1322   K 4.7 09/23/2011 0935   CL 108* 10/01/2011 1322   CL 106 09/23/2011 0935   CO2 27 10/01/2011 1322   CO2 27 09/23/2011 0935   BUN 14.0 10/01/2011 1322   BUN 14 09/23/2011 0935   CREATININE 0.7 10/01/2011 1322   CREATININE 0.72 09/23/2011 0935   CREATININE 0.64 01/15/2011 1520      Component Value Date/Time   CALCIUM 9.7 10/01/2011 1322   CALCIUM 9.6 09/23/2011 0935   ALKPHOS 76 10/01/2011 1322   ALKPHOS 80 01/15/2011 1520   AST 16 10/01/2011 1322   AST 18 01/15/2011 1520   ALT 17 10/01/2011 1322   ALT 22 01/15/2011 1520   BILITOT 0.70 10/01/2011 1322   BILITOT 0.7 01/15/2011 1520       Lab Results  Component Value Date   LABCA2 8 10/01/2011    No components found with this basename: [id number]    No results found for this basename: INR:1;PROTIME:1 in the last 168 hours  Urinalysis    Component Value Date/Time   LABSPEC 1.025 08/11/2008 1008    STUDIES: No results found. Most recent mammogram June 2013 was benign  ASSESSMENT: 46 y.o. Spanish speaker status post right breast biopsy May 2010 for a triple-negative, high-grade invasive ductal carcinoma with a very elevated MIB-1, treated neoadjuvantly with carboplatin and docetaxel x1, with a reaction to the docetaxel; then Abraxane and carboplatin, which had to be discontinued because of peripheral neuropathy, now largely resolved; finally carboplatin and Gemzar.  All chemotherapy completed in September 2010, followed by right lumpectomy and sentinel lymph node sampling October 2010 with a complete pathologic response.  She completed radiation treatments January  2011  PLAN: Karen Bullock is doing well, with no evidence of recurrent disease. I gave her a copy of her lab work so she can show it to her dentist in case he has any questions regarding her counts. At this point I am comfortable seeing her on a once a year basis. She will have her next mammogram June of 2014 and see me again next October. She knows to call for any problems that may develop before the next visit.   Karen Bullock C    10/08/2011

## 2011-10-10 ENCOUNTER — Telehealth: Payer: Self-pay | Admitting: *Deleted

## 2011-10-10 NOTE — Telephone Encounter (Signed)
10-08-2012 10:00am lab only  10-15-2012 11:00am md

## 2012-05-25 ENCOUNTER — Other Ambulatory Visit: Payer: Self-pay | Admitting: Physician Assistant

## 2012-05-25 ENCOUNTER — Other Ambulatory Visit: Payer: Self-pay | Admitting: Oncology

## 2012-05-25 DIAGNOSIS — Z853 Personal history of malignant neoplasm of breast: Secondary | ICD-10-CM

## 2012-06-26 ENCOUNTER — Ambulatory Visit
Admission: RE | Admit: 2012-06-26 | Discharge: 2012-06-26 | Disposition: A | Discharge status: Home / Self Care | Payer: PRIVATE HEALTH INSURANCE | Source: Ambulatory Visit | Attending: Oncology | Admitting: Oncology

## 2012-06-26 DIAGNOSIS — Z853 Personal history of malignant neoplasm of breast: Secondary | ICD-10-CM

## 2012-10-08 ENCOUNTER — Other Ambulatory Visit (HOSPITAL_BASED_OUTPATIENT_CLINIC_OR_DEPARTMENT_OTHER): Payer: 59 | Admitting: Lab

## 2012-10-08 ENCOUNTER — Other Ambulatory Visit: Payer: Self-pay | Admitting: *Deleted

## 2012-10-08 DIAGNOSIS — C50911 Malignant neoplasm of unspecified site of right female breast: Secondary | ICD-10-CM

## 2012-10-08 DIAGNOSIS — C50319 Malignant neoplasm of lower-inner quadrant of unspecified female breast: Secondary | ICD-10-CM

## 2012-10-08 LAB — COMPREHENSIVE METABOLIC PANEL (CC13)
ALT: 17 U/L (ref 0–55)
AST: 16 U/L (ref 5–34)
BUN: 9.9 mg/dL (ref 7.0–26.0)
Calcium: 9.3 mg/dL (ref 8.4–10.4)
Chloride: 108 mEq/L (ref 98–109)
Creatinine: 0.7 mg/dL (ref 0.6–1.1)
Sodium: 143 mEq/L (ref 136–145)

## 2012-10-08 LAB — CBC WITH DIFFERENTIAL/PLATELET
BASO%: 1.2 % (ref 0.0–2.0)
Basophils Absolute: 0.1 10*3/uL (ref 0.0–0.1)
EOS%: 4.9 % (ref 0.0–7.0)
HCT: 38.6 % (ref 34.8–46.6)
HGB: 12.9 g/dL (ref 11.6–15.9)
LYMPH%: 32.7 % (ref 14.0–49.7)
MCH: 29.7 pg (ref 25.1–34.0)
MCV: 89 fL (ref 79.5–101.0)
MONO%: 6.8 % (ref 0.0–14.0)
NEUT%: 54.4 % (ref 38.4–76.8)
Platelets: 239 10*3/uL (ref 145–400)
RBC: 4.33 10*6/uL (ref 3.70–5.45)
WBC: 7 10*3/uL (ref 3.9–10.3)
lymph#: 2.3 10*3/uL (ref 0.9–3.3)

## 2012-10-15 ENCOUNTER — Ambulatory Visit (HOSPITAL_BASED_OUTPATIENT_CLINIC_OR_DEPARTMENT_OTHER): Payer: 59 | Admitting: Oncology

## 2012-10-15 ENCOUNTER — Telehealth: Payer: Self-pay | Admitting: Oncology

## 2012-10-15 VITALS — BP 102/65 | HR 61 | Temp 98.0°F | Resp 19 | Ht 65.0 in | Wt 151.3 lb

## 2012-10-15 DIAGNOSIS — C50911 Malignant neoplasm of unspecified site of right female breast: Secondary | ICD-10-CM

## 2012-10-15 DIAGNOSIS — C50311 Malignant neoplasm of lower-inner quadrant of right female breast: Secondary | ICD-10-CM

## 2012-10-15 DIAGNOSIS — Z853 Personal history of malignant neoplasm of breast: Secondary | ICD-10-CM

## 2012-10-15 NOTE — Progress Notes (Signed)
ID: Karen Bullock   DOB: 12-Aug-1965  MR#: [id number]  MWU#:[id number]  PCP: Karen Bidding, DO GYN:  SU: Karen Bouillon MD OTHER MD:   HISTORY OF PRESENT ILLNESS: The patient tells me she noted a tender mass in her right breast in March. She thought this would go away on its own, but as it did not, she eventually brought it to the attention of Dr. Mayford Bullock in [facility], and he treated her initially with Augmentin and ibuprofen, but with no shrinkage of the mass, referred her to [facility] where on 06/02/2008 the patient underwent ultrasound-guided biopsy and clip placement under Karen Bullock.  The pathology from that procedure ([id number] and [id number]) showed a high-grade invasive ductal carcinoma, which was negative for both the estrogen and progesterone receptors at 0%, with a very elevated MIB-1 at 86% and negative for HER2-neu by CISH with a ratio of 0.97. Her subsequent history is as detailed below.  INTERVAL HISTORY: Karen Bullock returns today for follow up of her breast cancer. The interval history is unremarkable. She continues to work in the [place]. She doesn't exercise outside of work, but her work is physical.  REVIEW OF SYSTEMS: She's had some itching and what she describes as a yellow spot develop over her left breast, and she wanted me to look at that. She has rare headaches, which are not severe. They're not accompanied by dizziness, nausea or vomiting, or any visual changes. A detailed review of systems today was otherwise entirely negative  PAST MEDICAL HISTORY: Past Medical History  Diagnosis Date  . Breast cancer, right breast 01/15/2011  . Cancer     PAST SURGICAL HISTORY: Past Surgical History  Procedure Laterality Date  . Breast surgery  2010    lumpectomy    FAMILY HISTORY Family History  Problem Relation Age of Onset  . Diabetes Father   . Cancer Neg Hx   . Hypertension Neg Hx   . Heart disease Neg Hx   The patient's father is alive at  age 37, the patient's mother is alive at age 63.  The patient has 2 brothers and 2 sisters.  There is no other breast or ovarian cancer in the family to her knowledge.  GYNECOLOGIC HISTORY: She is GX, P3, first pregnancy age 34.  Her periods pretty much stopped with chemotherapy, although she had a final one July of 2013. The one before that was July of 2012.  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  Her husband, Karen Bullock, works in Copywriter, advertising in a [employer].  They have been married 25+ years.  Their son, Karen Bullock, lives in [place] and has 2 children.  Their daughter, Karen Bullock, lives in town with the patient as does her son, Karen Bullock.  Karen Bullock works in a a [place] market as well. The patient is a Air traffic controller.  She is originally from the [place] area of [place].   ADVANCED DIRECTIVES:  HEALTH MAINTENANCE: History  Substance Use Topics  . Smoking status: Never Smoker   . Smokeless tobacco: Not on file  . Alcohol Use: No     Colonoscopy:  PAP:  Bone density:  Lipid panel:  No Known Allergies  Current Outpatient Prescriptions  Medication Sig Dispense Refill  . acetaminophen (TYLENOL) 325 MG tablet Take 650 mg by mouth every 6 (six) hours as needed.        Marland Kitchen ibuprofen (ADVIL,MOTRIN) 200 MG tablet Take 200 mg by mouth every 6 (six) hours as needed.  No current facility-administered medications for this visit.    OBJECTIVE: Middle-aged Latin American woman who appears well Filed Vitals:   10/15/12 1117  BP: 102/65  Pulse: 61  Temp: 98 F (36.7 C)  Resp: 19     Body mass index is 25.18 kg/(m^2).    ECOG FS: 0  Sclerae unicteric Oropharynx clear No cervical or supraclavicular adenopathy Lungs no rales or rhonchi Heart regular rate and rhythm Abd soft, nontender, positive bowel sounds MSK no focal spinal tenderness, no peripheral edema Neuro: nonfocal, well oriented, pleasant affect Breasts: The right breast is status post lumpectomy and  radiation. There is no evidence of local recurrence. The right axilla is benign. I do not find any yellowness or other "spot in the left breast. There is mild skin dryness for she has been scratching slightly. There is no excoriation. The left breast is otherwise entirely benign  LAB RESULTS: Lab Results  Component Value Date   WBC 7.0 10/08/2012   NEUTROABS 3.8 10/08/2012   HGB 12.9 10/08/2012   HCT 38.6 10/08/2012   MCV 89.0 10/08/2012   PLT 239 10/08/2012      Chemistry      Component Value Date/Time   NA 143 10/08/2012 1020   NA 141 09/23/2011 0935   K 4.3 10/08/2012 1020   K 4.7 09/23/2011 0935   CL 108* 10/01/2011 1322   CL 106 09/23/2011 0935   CO2 26 10/08/2012 1020   CO2 27 09/23/2011 0935   BUN 9.9 10/08/2012 1020   BUN 14 09/23/2011 0935   CREATININE 0.7 10/08/2012 1020   CREATININE 0.72 09/23/2011 0935   CREATININE 0.64 01/15/2011 1520      Component Value Date/Time   CALCIUM 9.3 10/08/2012 1020   CALCIUM 9.6 09/23/2011 0935   ALKPHOS 77 10/08/2012 1020   ALKPHOS 80 01/15/2011 1520   AST 16 10/08/2012 1020   AST 18 01/15/2011 1520   ALT 17 10/08/2012 1020   ALT 22 01/15/2011 1520   BILITOT 0.81 10/08/2012 1020   BILITOT 0.7 01/15/2011 1520       Lab Results  Component Value Date   LABCA2 8 10/01/2011    No components found with this basename: [id number]    No results found for this basename: INR,  in the last 168 hours  Urinalysis    Component Value Date/Time   LABSPEC 1.025 08/11/2008 1008    STUDIES: Mammography 06/26/2012 was unremarkable  ASSESSMENT: 47 y.o. 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  All chemotherapy  completed in September 2010,   (4) followed by right lumpectomy and sentinel lymph node sampling October 2010 with a complete pathologic response.    (5) She completed radiation treatments January 2011  PLAN: Karen Bullock is now 4 years out from her definitive surgery, with no evidence of disease recurrence. This is very favorable. I alerted her to our cervical cancer screening available here in early November and in [facility] in late October. Otherwise we will see her again in one year. We will consider releasing her from followup at that time.   Shaquina Gillham C    10/15/2012

## 2012-12-23 ENCOUNTER — Telehealth: Payer: Self-pay | Admitting: Family Medicine

## 2012-12-23 ENCOUNTER — Ambulatory Visit (INDEPENDENT_AMBULATORY_CARE_PROVIDER_SITE_OTHER): Payer: 59 | Admitting: Family Medicine

## 2012-12-23 ENCOUNTER — Encounter: Payer: Self-pay | Admitting: Family Medicine

## 2012-12-23 ENCOUNTER — Ambulatory Visit (HOSPITAL_COMMUNITY)
Admission: RE | Admit: 2012-12-23 | Discharge: 2012-12-23 | Disposition: A | Discharge status: Home / Self Care | Payer: 59 | Source: Ambulatory Visit | Attending: Family Medicine | Admitting: Family Medicine

## 2012-12-23 VITALS — BP 118/81 | HR 77 | Temp 98.7°F | Ht 65.0 in | Wt 144.0 lb

## 2012-12-23 DIAGNOSIS — R55 Syncope and collapse: Secondary | ICD-10-CM

## 2012-12-23 DIAGNOSIS — M79602 Pain in left arm: Secondary | ICD-10-CM

## 2012-12-23 DIAGNOSIS — W19XXXA Unspecified fall, initial encounter: Secondary | ICD-10-CM | POA: Insufficient documentation

## 2012-12-23 DIAGNOSIS — M6520 Calcific tendinitis, unspecified site: Secondary | ICD-10-CM | POA: Insufficient documentation

## 2012-12-23 DIAGNOSIS — M79609 Pain in unspecified limb: Secondary | ICD-10-CM

## 2012-12-23 DIAGNOSIS — M25529 Pain in unspecified elbow: Secondary | ICD-10-CM | POA: Insufficient documentation

## 2012-12-23 MED ORDER — NAPROXEN SODIUM 220 MG PO TABS: 220.0000 mg | ORAL_TABLET | Freq: Two times a day (BID) | ORAL | Status: DC

## 2012-12-23 MED ORDER — HYDROCODONE-ACETAMINOPHEN 5-325 MG PO TABS: 1.0000 | ORAL_TABLET | Freq: Three times a day (TID) | ORAL | Status: DC | PRN

## 2012-12-23 NOTE — Telephone Encounter (Signed)
Called to communicate xray results from today. Spoke with husband and described that there is no fracture or acute issue. 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 Informed patient's husband that they could follow up in 2-5 days if more convenient than tomorrow's scheduled visit, since there is no fracture. Husband voiced understanding.  Leona Singleton, MD

## 2012-12-23 NOTE — Progress Notes (Signed)
Patient ID: Karen Bullock, female   DOB: Jun 03, 1965, 47 y.o.   MRN: [id number] Subjective:   CC: Arm pain after fall  HPI:   Exam conducted in Spanish.  1. Left arm pain - Pt reports falling on left arm 4 days ago and hitting arm on table, hitting back of head. She denies neuro changes or syncope. She fell because she got mad, turned quickly in the kitchen, and tripped over table.  She then fell again after getting out of the shower yesterday and hit arm again. She thinks fall was due to dizziness and fainting. She says arm is so tender she refuses to even move it but is able to try.   2. Syncope - Pt reports "seeing stars" just prior to becoming dizzy and fainting yesterday. Just before "seeing stars," she reports arm was very painful when walking from shower to bedroom. Per her and husband, she was not eating or drinking enough in week leading up to yesterday, due to decreased appetite. She thinks she was out for just a few seconds and then felt too tired to get up so took a nap on the floor. Reports myalgias after waking up. She denies difficulty breathing or chest pain, history of syncope, and does not report abnormal body movements, neurologic sequelae, or sensation of room spinning. She denies difficulty speaking, walking, seeing, or hearing. She denies h/o anemia or recent illness. Denies new medications.  Review of Systems - Per HPI.  PMH: Reviewed H/o breast cancer   Objective:  Physical Exam BP 118/81  Pulse 77  Temp(Src) 98.7 F (37.1 C) (Oral)  Ht 5\' 5"  (1.651 m)  Wt 144 lb (65.318 kg)  BMI 23.96 kg/m2 GEN: NAD, pleasant CV: RRR, 2+ radial pulse bilaterally PULM: normal effort EXTR: No obvious deformity or swelling or erythema left arm, mild increased warmth left arm; Left arm extremely tender with palpation just above lateral and medial epicondyles, olecranon nontender, forearm nontender, shaft of humerus also exquisitely tender, left shoulder extremely tender. Allows  passive ROM of wrist, Normal sensation in fingers. Resists passive ROM of elbow or shoulder. Bilateral LE nonedematous, nonerythematous, nontender Neuro: CN 2-12 tested and intact, normal gait, normal speech, able to stand, though makes any movement hesitantly while holding left arm for fear of increasing pain.  Left arm imaging:  DG Shoulder L: IMPRESSION:  Calcific densities noted projecting in the course of the  supraspinatus tendon suggesting calcific supraspinatus tendonitis.  Calcified loose bodies in the glenohumeral joint could also present  in this fashion. Exam otherwise normal.  DG humerus left: IMPRESSION:  1. Calcific densities projected over the supraspinatus tendon as  described on left shoulder series.  2. Left humerus normal.  DG elbow complete left: IMPRESSION:  Negative.   Assessment:     Karen Bullock is a 47 y.o. female here for follow up of fall on left arm and subsequent syncope.    Plan:     # See problem list and after visit summary for problem-specific plans.   # Health Maintenance: Not discussed.  Follow-up: Follow up in 1-3 days for follow up of arm pain and syncope.   Leona Singleton, MD [facility]

## 2012-12-23 NOTE — Assessment & Plan Note (Signed)
Most likely orthostatic hypotension vs vasovagal given pain, as pt describes decreased PO this week due to decreased appetite, and "seeing stars" and fainting after significant arm pain when walking back to bedroom from shower. Exam and hx not consistent with PE or cardiogenic syncope. DDx still includes anemia, seizure, aortic stenosis, and neurocardiogenic.  - Return in 2-3 days for re-evaluation. - Will obtain orthostatics, CBC, EKG, and further neuro exam (romberg, FNF) on f/u. - Encouraged improved PO intake especially with liquids.

## 2012-12-23 NOTE — Patient Instructions (Signed)
Ir a [facility] por xray.  Ellos deben llamar nos con resultados. Regresa aqui en 2-3 dias para re-evaluacion.  Leona Singleton, MD

## 2012-12-23 NOTE — Assessment & Plan Note (Signed)
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 Pt likely has soft tissue bruising.  - Arm placed in sling for symptomatic management while sending to [facility] for xrays. - Vicodin PRN severe pain, aleve BID prescribed for daily use x 1 week - Return in 2-3 days for re-evaluation.

## 2012-12-24 ENCOUNTER — Emergency Department (INDEPENDENT_AMBULATORY_CARE_PROVIDER_SITE_OTHER)
Admission: EM | Admit: 2012-12-24 | Discharge: 2012-12-24 | Disposition: A | Discharge status: Home / Self Care | Payer: 59 | Source: Home / Self Care | Attending: Family Medicine | Admitting: Family Medicine

## 2012-12-24 ENCOUNTER — Emergency Department (INDEPENDENT_AMBULATORY_CARE_PROVIDER_SITE_OTHER): Payer: 59

## 2012-12-24 ENCOUNTER — Emergency Department (HOSPITAL_COMMUNITY): Payer: 59

## 2012-12-24 ENCOUNTER — Encounter (HOSPITAL_COMMUNITY): Payer: Self-pay | Admitting: Emergency Medicine

## 2012-12-24 ENCOUNTER — Ambulatory Visit (INDEPENDENT_AMBULATORY_CARE_PROVIDER_SITE_OTHER): Payer: 59 | Admitting: Sports Medicine

## 2012-12-24 ENCOUNTER — Encounter: Payer: Self-pay | Admitting: Sports Medicine

## 2012-12-24 VITALS — BP 100/61 | HR 63 | Temp 98.3°F | Ht 65.0 in | Wt 146.0 lb

## 2012-12-24 DIAGNOSIS — M79609 Pain in unspecified limb: Secondary | ICD-10-CM

## 2012-12-24 DIAGNOSIS — M79602 Pain in left arm: Secondary | ICD-10-CM

## 2012-12-24 DIAGNOSIS — S43006A Unspecified dislocation of unspecified shoulder joint, initial encounter: Secondary | ICD-10-CM

## 2012-12-24 DIAGNOSIS — S43005A Unspecified dislocation of left shoulder joint, initial encounter: Secondary | ICD-10-CM

## 2012-12-24 MED ORDER — HYDROMORPHONE HCL PF 1 MG/ML IJ SOLN
INTRAMUSCULAR | Status: AC
  Filled 2012-12-24: qty 1

## 2012-12-24 MED ORDER — KETOROLAC TROMETHAMINE 60 MG/2ML IM SOLN
60.0000 mg | Freq: Once | INTRAMUSCULAR | Status: AC
  Administered 2012-12-24: 60 mg via INTRAMUSCULAR

## 2012-12-24 MED ORDER — HYDROCODONE-ACETAMINOPHEN 5-325 MG PO TABS: 1.0000 | ORAL_TABLET | Freq: Three times a day (TID) | ORAL | Status: DC | PRN

## 2012-12-24 MED ORDER — HYDROMORPHONE HCL PF 1 MG/ML IJ SOLN
1.0000 mg | Freq: Once | INTRAMUSCULAR | Status: AC
  Administered 2012-12-24: 1 mg via INTRAMUSCULAR

## 2012-12-24 MED ORDER — KETOROLAC TROMETHAMINE 60 MG/2ML IM SOLN
INTRAMUSCULAR | Status: AC
  Filled 2012-12-24: qty 2

## 2012-12-24 NOTE — Patient Instructions (Signed)
   Go to the [facility] to see Dr. Denyse Amass    If you need anything prior to your next visit please call the clinic. Please Bring all medications or accurate medication list with you to each appointment; an accurate medication list is essential in providing you the best care possible.

## 2012-12-24 NOTE — Assessment & Plan Note (Addendum)
Clinically shoulder is anterior and exquisite pain with any motion (rom markedly limited).  No scapular Y view but appears to be dislocated.    Discussed with Dr. Clementeen Graham at [facility] urgent care regarding reimaging and attempted relocation with analgesics and glenohumeralanesthetic.  Patient aware of potential further intervention with sedation but trying to minimize cost.  > Follow up- likely needs to see orthopedics given likely rotator cuff involvement Secondary syncopal episode likely secondary to pain from dislocation.  Will need to be followed up if continues to have symptoms.

## 2012-12-24 NOTE — Progress Notes (Signed)
  Karen Bullock - 47 y.o. female MRN [id number]  Date of birth: May 11, 1965  CC, HPI, INTERVAL HISTORY & ROS  Karen Bullock is here today for follow up from a fall on her left side 5 days ago.    She reports her left arm continues to hurt her significantly.  She has not been able to move her arm at all.  She is unable to shower.  X-rays were obtained yesterday that showed no acute fracture but no scapular Y. view was obtained and unable to assess for dislocation.  History  Past Medical, Surgical, Social, and Family History Reviewed per EMR Medications and Allergies reviewed and all updated if necessary. Objective Findings  VITALS: HR: 63 bpm  BP: 100/61 mmHg  TEMP: 98.3 F (36.8 C) (Oral)  RESP:    HT: 5\' 5"  (165.1 cm)  WT: 146 lb (66.225 kg)  BMI: 24.3   BP Readings from Last 3 Encounters:  12/24/12 100/61  12/23/12 118/81  10/15/12 102/65   Wt Readings from Last 3 Encounters:  12/24/12 146 lb (66.225 kg)  12/23/12 144 lb (65.318 kg)  10/15/12 151 lb 4.8 oz (68.629 kg)     PHYSICAL EXAM: GENERAL: Hispanic adult  female. In no discomfort; no respiratory distress  PSYCH: alert and appropriate, good insight   HNEENT:   CARDIO:   LUNGS:   ABDOMEN:   EXTREM:  Sensation grossly intact but exquisitely painful throughout, capillary refill less than 2 seconds, pulses 2+ out of 4.  Left humerus is sitting anterior and inferior with anterior prominence  GU:   SKIN:     Assessment & Plan   Problems addressed today: General Plan & Pt Instructions:  No diagnosis found.    Go to the [facility] to see Dr. Denyse Amass      For further discussion of A/P and for follow up issues see problem based charting if applicable.

## 2012-12-24 NOTE — ED Notes (Signed)
Pt  Fell  4  Days  Ago       Decreased  rom   l  Arm    With      Pain

## 2012-12-24 NOTE — ED Provider Notes (Signed)
Karen Bullock is a 47 y.o. female who presents to Urgent Care today for left shoulder pain. Patient fell landing on her outstretched left shoulder 4 days ago. She had moderate pain however 2 days ago she fell again in the shower. Since then she's had severe shoulder pain worse with motion. She was evaluated the family practice Center yesterday and had a relatively normal shoulder and humerus x-ray. However she returned with a practice Center this morning where a primary care for his medicine physician was concerned that she has a shoulder dislocation.  The patient denies any radiating pain weakness.  She does note some numbness along the lateral deltoid region. She has been taking naproxen and hydrocodone which did help some. She has been wearing a sling.   Upon my personal review of the x-ray findings from December 17 the humerus appears to be inferior on the scapular Y. Film.   Past Medical History  Diagnosis Date  . Breast cancer, right breast 01/15/2011  . Cancer    History  Substance Use Topics  . Smoking status: Never Smoker   . Smokeless tobacco: Not on file  . Alcohol Use: No   ROS as above Medications reviewed. No current facility-administered medications for this encounter.   Current Outpatient Prescriptions  Medication Sig Dispense Refill  . acetaminophen (TYLENOL) 325 MG tablet Take 650 mg by mouth every 6 (six) hours as needed.        Marland Kitchen HYDROcodone-acetaminophen (NORCO/VICODIN) 5-325 MG per tablet Take 1 tablet by mouth every 8 (eight) hours as needed for moderate pain.  10 tablet  0  . ibuprofen (ADVIL,MOTRIN) 200 MG tablet Take 200 mg by mouth every 6 (six) hours as needed.       . naproxen sodium (ALEVE) 220 MG tablet Take 1 tablet (220 mg total) by mouth 2 (two) times daily with a meal.  30 tablet  0    Exam:  BP 119/81  Pulse 59  Temp(Src) 97.6 F (36.4 C) (Oral)  Resp 12  SpO2 99% Gen: Well NAD LEFT SHOULDER: No sulcus visible however slightly abnormal and tender.  Significant shoulder pain and lack of range of motion. Capillary refill and sensation are intact distally. Patient has very mild numbness lateral deltoid.   Grip strength is intact distally..    Reduction of left shoulder dislocation:   Patient was given 1 mg of IM allotted. The arm is abducted to about 120. Patient was experiencing too much pain and procedure was discontinued.   Patient was then given one more milligram of IM allotted and 60 mg of IM Toradol. After 20 minutes patient had significant improvement in pain. Axillary traction with abduction to 170 internal rotation and then adduction achieved abduction of the shoulder dislocation.  Patient felt much better following the reduction.   Followup x-rays were performed without placing patient in abduction.  She was placed into a shoulder immobilizer.   No results found for this or any previous visit (from the past 24 hour(s)). Dg Elbow Complete Left  12/23/2012   CLINICAL DATA:  Fall.  Pain.  EXAM: LEFT ELBOW - COMPLETE 3+ VIEW  COMPARISON:  None.  FINDINGS: There is no evidence of fracture, dislocation, or joint effusion. There is no evidence of arthropathy or other focal bone abnormality. Soft tissues are unremarkable.  IMPRESSION: Negative.   Electronically Signed   By: Maisie Fus  Register   On: 12/23/2012 12:39   Dg Shoulder Left  12/24/2012   CLINICAL DATA:  Left shoulder injury.  EXAM: LEFT SHOULDER - 2+ VIEW  COMPARISON:  Same date  FINDINGS: Visualized ribs appear normal. No fracture dislocation is noted. No significant degenerative changes noted. Calcific densities are again noted over the humeral head concerning for calcific tendonitis.  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 No acute abnormality seen in left shoulder.   Electronically Signed   By: Roque Lias M.D.   On: 12/24/2012 12:40   Dg Shoulder Left  12/23/2012   CLINICAL DATA:  Fall.  Pain.  EXAM: LEFT SHOULDER - 2+ VIEW  COMPARISON:  None.   FINDINGS: Calcific densities noted projected over the course of the supraspinatus tendon consistent with calcific supraspinatus tendinitis. Loose bodies within the glenohumeral joint space could also present in this fashion. No evidence of fracture or dislocation.  IMPRESSION: Calcific densities noted projecting in the course of the supraspinatus tendon suggesting calcific supraspinatus tendonitis. Calcified loose bodies in the glenohumeral joint could also present in this fashion. Exam otherwise normal.   Electronically Signed   By: Maisie Fus  Register   On: 12/23/2012 12:41   Dg Humerus Left  12/23/2012   CLINICAL DATA:  Fall.  Pain.  EXAM: LEFT HUMERUS - 2+ VIEW  COMPARISON:  None.  FINDINGS: Calcific densities noted projected in the region of the supraspinatus tendon as described on left shoulder series. Left humerus appears normal. No evidence of fracture.  IMPRESSION: 1. Calcific densities projected over the supraspinatus tendon as described on left shoulder series. 2. Left humerus normal.   Electronically Signed   By: Maisie Fus  Register   On: 12/23/2012 12:42    Assessment and Plan: 47 y.o. female with status post reduction of left shoulder dislocation. Patient was placed into a shoulder immobilizer given more hydrocodone and instructed to followup with Dr. Berline Chough. Discussed warning signs or symptoms. Please see discharge instructions. Patient expresses understanding.      Rodolph Bong, MD 12/24/12 1250

## 2012-12-28 ENCOUNTER — Encounter: Payer: Self-pay | Admitting: Family Medicine

## 2012-12-28 ENCOUNTER — Ambulatory Visit (INDEPENDENT_AMBULATORY_CARE_PROVIDER_SITE_OTHER): Payer: 59 | Admitting: Family Medicine

## 2012-12-28 VITALS — BP 107/65 | HR 53 | Temp 98.1°F | Ht 65.0 in | Wt 151.5 lb

## 2012-12-28 DIAGNOSIS — M79602 Pain in left arm: Secondary | ICD-10-CM

## 2012-12-28 DIAGNOSIS — M79609 Pain in unspecified limb: Secondary | ICD-10-CM

## 2012-12-28 NOTE — Patient Instructions (Signed)
Continua reposo del hombro por 2 semanas mas. Usalo incluso para dormir. Has una cita de [facility] en 2 semanas.

## 2012-12-28 NOTE — Progress Notes (Signed)
Family Medicine Office Visit Note   Subjective:   Patient ID: Karen Bullock, female  DOB: 08-25-1965, 47 y.o.. MRN: [id number]   Pt that comes today to f/u her shoulder dislocation after trauma. Shoulder was reduced on the 12/18th at Urgent Care and pt was placed in a shoulder rest device and instructed to f/u in 5 days for possible removing shoulder brace. She reports her pain is controlled and is not taking any medication for it.   Review of Systems:  Per HPI. Denies fever, chills, nausea, vomiting, headaches, dizziness, numbness or focalized weakness.   Objective:   Physical Exam: Gen:  NAD HEENT: Moist mucous membranes  CV: Regular rate and rhythm, no murmurs PULM: Clear to auscultation bilaterally.  Neuro: Alert and oriented x3. No focalization MSK: Left shoulder: no deformity, edema or erythema. Full ROM passive and active. Neurovascular intact.   Assessment & Plan:

## 2012-12-29 NOTE — Assessment & Plan Note (Signed)
Discussed with attending Dr. Jennette Kettle. Pt symptoms (pain has improved). Instructed to continue with arm rest for 2 weeks and f[facility]u [facility].  Discussed signs of worsening condition that should prompt re-evaluation.

## 2013-01-13 ENCOUNTER — Encounter: Payer: Self-pay | Admitting: Sports Medicine

## 2013-01-13 ENCOUNTER — Ambulatory Visit (INDEPENDENT_AMBULATORY_CARE_PROVIDER_SITE_OTHER): Payer: 59 | Admitting: Sports Medicine

## 2013-01-13 VITALS — BP 103/70 | Ht 65.0 in | Wt 148.0 lb

## 2013-01-13 DIAGNOSIS — M25512 Pain in left shoulder: Secondary | ICD-10-CM

## 2013-01-13 DIAGNOSIS — M25519 Pain in unspecified shoulder: Secondary | ICD-10-CM

## 2013-01-13 NOTE — Progress Notes (Signed)
   Subjective:    Patient ID: Karen Bullock, female    DOB: [birth year], 48 y.o.   MRN: [id number]  HPI chief complaint: Left shoulder  Right-hand-dominant Hispanic female comes in today complaining of 1 month of left shoulder pain. She initially injured herself after falling on an outstretched left arm. Immediate onset of shoulder pain. Was seen in the emergency room on December 17 and x-rays showed no obvious fracture or dislocation. Patient was discharged home but returned to urgent care the following day. Dr. Georgina Snell reviewed her x-rays and felt like she had a shoulder dislocation. Per the urgent care notes a successful reduction was performed. The patient herself says she had immediate pain relief with manipulation. Since that time she has been in a sling and taking naproxen sodium. She continues to have limited mobility of the arm as well as lateral shoulder pain. She describes weakness as well. She denies any problems with the shoulder prior to her injury. No associated numbness or tingling. No neck pain. No prior shoulder surgery. Entire history is obtained through an interpreter.  Past medical history is reviewed Current medications are reviewed Medications are reviewed Allergies are reviewed Social history reviewed    Review of Systems     Objective:   Physical Exam Well-developed, well-nourished. No acute distress. Awake alert and oriented x3.  Left shoulder: Limited active motion in all planes secondary to pain. Active forward flexion and abduction to 50. I am able to passively carry her to 90. No soft tissue swelling. No tenderness to palpation along the clavicle or over the a.c. joint. There is global left upper extremity weakness secondary to pain. Positive Hawkins, positive empty can. Positive O'Brien's. Positive apprehension. Neurovascularly intact distally.  X-rays from December 17 and December 18 are reviewed. None of the films including the scapular Y. films demonstrate any  obvious shoulder dislocation or fracture. There is what appears to be an intra-articular loose body in the glenohumeral joint. MSK ultrasound of the left shoulder was performed. Limited images of the supraspinatus tendon shows the majority of the tendon to appear to be intact. There is a hypoechoic area in the inferior portion of the tendon which may represent a tear here versus anisotropy.       Assessment & Plan:  Left shoulder pain-question recent shoulder dislocation  None of the x-rays show me an obvious shoulder dislocation. Nonetheless, in reviewing the patient's history and Dr. Clovis Riley urgent care notes, it certainly does sound as though she may have suffered a dislocation at the time of her fall. If that is indeed the case she has a high chance of having suffered a significant rotator cuff tear at the time of her injury. Even though the ultrasound does not show an obvious tear I think we should pursue an MRI scan to evaluate this further. She will followup with me one to 2 days after that study. If she does in fact have a full-thickness rotator cuff tear then we will consider surgical referral. In the meantime, she is to discontinue her sling and start pendulum exercises. She will discontinue her naproxen sodium as well.

## 2013-01-21 ENCOUNTER — Inpatient Hospital Stay: Admission: RE | Admit: 2013-01-21 | Payer: 59 | Source: Ambulatory Visit

## 2013-02-01 ENCOUNTER — Ambulatory Visit: Payer: 59 | Admitting: Sports Medicine

## 2013-02-03 ENCOUNTER — Ambulatory Visit
Admission: RE | Admit: 2013-02-03 | Discharge: 2013-02-03 | Disposition: A | Discharge status: Home / Self Care | Payer: 59 | Source: Ambulatory Visit | Attending: Sports Medicine | Admitting: Sports Medicine

## 2013-02-03 DIAGNOSIS — M25512 Pain in left shoulder: Secondary | ICD-10-CM

## 2013-02-09 ENCOUNTER — Ambulatory Visit: Payer: 59 | Admitting: Sports Medicine

## 2013-02-12 ENCOUNTER — Telehealth: Payer: Self-pay | Admitting: Sports Medicine

## 2013-02-12 NOTE — Telephone Encounter (Signed)
Patient failed to keep her appointment to discuss MRI findings of her left shoulder. The MRI does not show a rotator cuff tear. We will try to reschedule her to discuss results face-to-face.

## 2013-02-25 ENCOUNTER — Ambulatory Visit: Payer: 59 | Admitting: Sports Medicine

## 2013-03-05 ENCOUNTER — Ambulatory Visit: Payer: 59 | Admitting: Sports Medicine

## 2013-03-15 ENCOUNTER — Ambulatory Visit (INDEPENDENT_AMBULATORY_CARE_PROVIDER_SITE_OTHER): Payer: 59 | Admitting: Sports Medicine

## 2013-03-15 ENCOUNTER — Encounter: Payer: Self-pay | Admitting: Sports Medicine

## 2013-03-15 VITALS — BP 103/67 | HR 65 | Ht 65.0 in | Wt 148.0 lb

## 2013-03-15 DIAGNOSIS — M25512 Pain in left shoulder: Secondary | ICD-10-CM

## 2013-03-15 DIAGNOSIS — M25519 Pain in unspecified shoulder: Secondary | ICD-10-CM

## 2013-03-15 NOTE — Progress Notes (Signed)
   Subjective:    Patient ID: Karen Bullock, female    DOB: Jun 10, 1965, 48 y.o.   MRN: [id number]  HPI Patient comes in today to discuss MRI findings of the left shoulder. She has a history of a questionable left shoulder dislocation which occurred several weeks ago. MRI was performed to rule out a full-thickness rotator cuff tear. Since her last office visit, her pain has improved on naproxen and Tylenol. She is regaining mobility. Main complaint is popping in the shoulder from time to time which she states feels like the "bone is popping". She is here today with her husband. Entire history is obtained with the help of an interpreter.    Review of Systems     Objective:   Physical Exam Well-developed, well-nourished. No acute distress. Awake alert and oriented x3. Passive range of motion of the left shoulder is full. Active forward flexion is to about 90. Active abduction 80. Nearly full internal and external rotation. Rotator cuff strength is 5/5. Positive empty can, positive Hawkins. Negative apprehension. Neurovascularly intact distally.  MRI of the left shoulder done 02/03/2013 is reviewed. No evidence of full-thickness rotator cuff tear. There is some rotator cuff tendinopathy as well as some subacromial bursitis. Nothing acute is seen.       Assessment & Plan:  Improving left shoulder pain status post questionable dislocation with MRI evidence of rotator cuff tendinopathy and subacromial bursitis  I reviewed the patient's options with her today. Options include continuing to take a watchful waiting approach, physical therapy, and subacromial cortisone injection. At this point in time, she would like to continue with a watchful waiting approach. She understands that if her symptoms persist that we could consider a subacromial cortisone injection down the road. She is reassured that I see no acute findings in her shoulder. The popping she is experiencing is due to the subacromial  bursitis. She may continue on both the naproxen and Tylenol as needed and will followup with me when necessary.

## 2013-05-28 ENCOUNTER — Other Ambulatory Visit: Payer: Self-pay | Admitting: Oncology

## 2013-05-28 DIAGNOSIS — Z853 Personal history of malignant neoplasm of breast: Secondary | ICD-10-CM

## 2013-06-28 ENCOUNTER — Ambulatory Visit
Admission: RE | Admit: 2013-06-28 | Discharge: 2013-06-28 | Disposition: A | Discharge status: Home / Self Care | Payer: PRIVATE HEALTH INSURANCE | Source: Ambulatory Visit | Attending: Oncology | Admitting: Oncology

## 2013-06-28 DIAGNOSIS — Z853 Personal history of malignant neoplasm of breast: Secondary | ICD-10-CM

## 2013-10-14 ENCOUNTER — Ambulatory Visit: Payer: PRIVATE HEALTH INSURANCE | Admitting: Oncology

## 2013-10-14 ENCOUNTER — Other Ambulatory Visit: Payer: PRIVATE HEALTH INSURANCE

## 2013-10-15 ENCOUNTER — Telehealth: Payer: Self-pay | Admitting: *Deleted

## 2013-10-15 NOTE — Telephone Encounter (Signed)
This RN spoke with Karen Bullock - spanish interpreter- per her discussion with pt inquiring " when is my appointment ?" Karen Bullock states pt also has complaints of overall body aches and bone pain.  Per review noted pt's had appointment yesterday.  Appointment rescheduled for later this month - informed Karen Bullock- pt's concerns will be reviewed with her at visit.

## 2013-10-27 ENCOUNTER — Other Ambulatory Visit: Payer: Self-pay | Admitting: *Deleted

## 2013-10-27 DIAGNOSIS — C50311 Malignant neoplasm of lower-inner quadrant of right female breast: Secondary | ICD-10-CM

## 2013-10-28 ENCOUNTER — Ambulatory Visit (HOSPITAL_BASED_OUTPATIENT_CLINIC_OR_DEPARTMENT_OTHER): Payer: PRIVATE HEALTH INSURANCE | Admitting: Oncology

## 2013-10-28 ENCOUNTER — Other Ambulatory Visit (HOSPITAL_BASED_OUTPATIENT_CLINIC_OR_DEPARTMENT_OTHER): Payer: PRIVATE HEALTH INSURANCE

## 2013-10-28 DIAGNOSIS — Z853 Personal history of malignant neoplasm of breast: Secondary | ICD-10-CM

## 2013-10-28 DIAGNOSIS — C50311 Malignant neoplasm of lower-inner quadrant of right female breast: Secondary | ICD-10-CM

## 2013-10-28 LAB — CBC WITH DIFFERENTIAL/PLATELET
BASO%: 0.7 % (ref 0.0–2.0)
Basophils Absolute: 0.1 10*3/uL (ref 0.0–0.1)
EOS%: 3.5 % (ref 0.0–7.0)
Eosinophils Absolute: 0.3 10*3/uL (ref 0.0–0.5)
HEMATOCRIT: 38.4 % (ref 34.8–46.6)
HGB: 12.6 g/dL (ref 11.6–15.9)
LYMPH#: 2.2 10*3/uL (ref 0.9–3.3)
LYMPH%: 27.9 % (ref 14.0–49.7)
MCH: 28.9 pg (ref 25.1–34.0)
MCHC: 32.7 g/dL (ref 31.5–36.0)
MCV: 88.5 fL (ref 79.5–101.0)
MONO#: 0.5 10*3/uL (ref 0.1–0.9)
MONO%: 6 % (ref 0.0–14.0)
NEUT#: 4.9 10*3/uL (ref 1.5–6.5)
NEUT%: 61.9 % (ref 38.4–76.8)
Platelets: 265 10*3/uL (ref 145–400)
RBC: 4.34 10*6/uL (ref 3.70–5.45)
RDW: 14 % (ref 11.2–14.5)
WBC: 8 10*3/uL (ref 3.9–10.3)

## 2013-10-29 LAB — COMPREHENSIVE METABOLIC PANEL (CC13)
ALT: 20 U/L (ref 0–55)
AST: 16 U/L (ref 5–34)
Albumin: 3.9 g/dL (ref 3.5–5.0)
Alkaline Phosphatase: 79 U/L (ref 40–150)
Anion Gap: 7 mEq/L (ref 3–11)
BILIRUBIN TOTAL: 0.96 mg/dL (ref 0.20–1.20)
BUN: 11.8 mg/dL (ref 7.0–26.0)
CHLORIDE: 105 meq/L (ref 98–109)
CO2: 29 mEq/L (ref 22–29)
CREATININE: 0.9 mg/dL (ref 0.6–1.1)
Calcium: 9.6 mg/dL (ref 8.4–10.4)
Glucose: 108 mg/dl (ref 70–140)
Potassium: 3.7 mEq/L (ref 3.5–5.1)
Sodium: 141 mEq/L (ref 136–145)
Total Protein: 7.4 g/dL (ref 6.4–8.3)

## 2013-10-30 NOTE — Progress Notes (Signed)
ID: Karen Bullock   DOB: 12-03-1965  MR#: [id number]  CSN#:[id number]  PCP: Cordelia Poche, MD GYN:  SU: Erroll Luna MD OTHER MD:   HISTORY OF PRESENT ILLNESS: The patient tells me she noted a tender mass in her right breast in March. She thought this would go away on its own, but as it did not, she eventually brought it to the attention of Dr. Jimmye Norman in [facility], and he treated her initially with Augmentin and ibuprofen, but with no shrinkage of the mass, referred her to [facility] where on 06/02/2008 the patient underwent ultrasound-guided biopsy and clip placement under Johnnette Gourd.  The pathology from that procedure ([id number] and [id number]) showed a high-grade invasive ductal carcinoma, which was negative for both the estrogen and progesterone receptors at 0%, with a very elevated MIB-1 at 86% and negative for HER2-neu by CISH with a ratio of 0.97. Her subsequent history is as detailed below.  INTERVAL HISTORY: Karen Bullock returns today for follow up of her breast cancer. She is doing well generally and went to [place] to see the [place]. She does not exercise outside of work but her work is very physical and "keeps me in shape."  REVIEW OF SYSTEMS: She had a strange feeling in her left foot, which eventually moved up her left leg and then her left flank. All that occurred over a period of days and has resolved. It has not recurred. The feeling was not a pain or cramp or tingling-- more like a "heavyness." There was no fever, rash or swelling. She does have some aches and pains here and there, especially her lower back, but this is not more intense or persistent than before. A detailed review of systems today was otherwise entirely negative  PAST MEDICAL HISTORY: Past Medical History  Diagnosis Date  . Breast cancer, right breast 01/15/2011  . Cancer     PAST SURGICAL HISTORY: Past Surgical History  Procedure Laterality Date  . Breast surgery  2010    lumpectomy     FAMILY HISTORY Family History  Problem Relation Age of Onset  . Diabetes Father   . Cancer Neg Hx   . Hypertension Neg Hx   . Heart disease Neg Hx   The patient's father is alive at age 26, the patient's mother is alive at age 37.  The patient has 2 brothers and 2 sisters.  There is no other breast or ovarian cancer in the family to her knowledge.  GYNECOLOGIC HISTORY: She is GX, P3, first pregnancy age 19.  Her periods pretty much stopped with chemotherapy, although she had a final one July of 2013. The one before that was July of 2012.  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  Her husband, Verna Desrocher, works in Technical sales engineer in a [employer]  They have been married 25+ years.  Their son, Karen Bullock, lives in [place] and has 2 children.  Their daughter, Karen Bullock, lives in town with the patient as does her son, Karen Bullock.  Karen Bullock works in a a [employer] as well. The patient is a Nurse, learning disability.  She is originally from the [place] of [place].   ADVANCED DIRECTIVES:  HEALTH MAINTENANCE: History  Substance Use Topics  . Smoking status: Never Smoker   . Smokeless tobacco: Not on file  . Alcohol Use: No     Colonoscopy:  PAP:  Bone density:  Lipid panel:  No Known Allergies  Current Outpatient Prescriptions  Medication Sig Dispense Refill  .  acetaminophen (TYLENOL) 325 MG tablet Take 650 mg by mouth every 6 (six) hours as needed.        Marland Kitchen ibuprofen (ADVIL,MOTRIN) 200 MG tablet Take 200 mg by mouth every 6 (six) hours as needed.       . naproxen sodium (ALEVE) 220 MG tablet Take 1 tablet (220 mg total) by mouth 2 (two) times daily with a meal.  30 tablet  0   No current facility-administered medications for this visit.    OBJECTIVE: Middle-aged Latin American woman who appears well Wt 150.4  HR 60  RR 17  T 98.6  BP  101/58  ECOG FS: 0  Sclerae unicteric, pupils equal and reactive Oropharynx clear and moist No cervical or supraclavicular  adenopathy Lungs no rales or rhonchi Heart regular rate and rhythm Abd soft, nontender, positive bowel sounds MSK no focal spinal tenderness, no upper extremity lymphedema Neuro: nonfocal, well oriented, plositive affect Breasts: The right breast is status post lumpectomy and radiation. There is no evidence of local recurrence. The right axilla is benign. The left breast is unremarkable  LAB RESULTS: Lab Results  Component Value Date   WBC 8.0 10/28/2013   NEUTROABS 4.9 10/28/2013   HGB 12.6 10/28/2013   HCT 38.4 10/28/2013   MCV 88.5 10/28/2013   PLT 265 10/28/2013      Chemistry      Component Value Date/Time   NA 141 10/28/2013 1528   NA 141 09/23/2011 0935   K 3.7 10/28/2013 1528   K 4.7 09/23/2011 0935   CL 108* 10/01/2011 1322   CL 106 09/23/2011 0935   CO2 29 10/28/2013 1528   CO2 27 09/23/2011 0935   BUN 11.8 10/28/2013 1528   BUN 14 09/23/2011 0935   CREATININE 0.9 10/28/2013 1528   CREATININE 0.72 09/23/2011 0935   CREATININE 0.64 01/15/2011 1520      Component Value Date/Time   CALCIUM 9.6 10/28/2013 1528   CALCIUM 9.6 09/23/2011 0935   ALKPHOS 79 10/28/2013 1528   ALKPHOS 80 01/15/2011 1520   AST 16 10/28/2013 1528   AST 18 01/15/2011 1520   ALT 20 10/28/2013 1528   ALT 22 01/15/2011 1520   BILITOT 0.96 10/28/2013 1528   BILITOT 0.7 01/15/2011 1520       Lab Results  Component Value Date   LABCA2 8 10/01/2011    No components found with this basename: [id number]    No results found for this basename: INR,  in the last 168 hours  Urinalysis    Component Value Date/Time   LABSPEC 1.025 08/11/2008 1008    STUDIES: CLINICAL DATA: History of right breast cancer, status post  lumpectomy and radiation therapy in 2010. Annual examination.  Asymptomatic. Spanish interpreter was present today to assist with  translation.  EXAM:  DIGITAL DIAGNOSTIC BILATERAL MAMMOGRAM WITH CAD  COMPARISON: With priors  ACR Breast Density Category b: There are scattered areas of   fibroglandular density.  FINDINGS:  There are stable lumpectomy changes in the medial right breast. No  mass, nonsurgical distortion, or suspicious microcalcification is  identified in either breast to suggest malignancy.  Mammographic images were processed with CAD.  IMPRESSION:  No evidence of malignancy in either breast. Lumpectomy changes in  the right breast.  RECOMMENDATION:  Diagnostic mammogram is suggested in 1 year. (Code:[id number]-B-01Y)  I have discussed the findings and recommendations with the patient.  Results were also provided in writing at the conclusion of the  visit. If applicable, a reminder letter  will be sent to the patient  regarding the next appointment.  BI-RADS CATEGORY 2: Benign Finding(s)  Electronically Signed  By: Curlene Dolphin M.D.  On: 06/28/2013 14:28   ASSESSMENT: 48 y.o. 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  All chemotherapy completed in September 2010,   (4) followed by right lumpectomy and sentinel lymph node sampling October 2010 with a complete pathologic response.    (5) She completed radiation treatments January 2011  PLAN: Cala is now 5 years out from her definitive surgery for breast cancer. She understands that estrogen receptor positive tumors, if they recur, tend to recur early. Accordingly I am comfortable releasing ehr to her primary care physician. She will nee a yearly MD breast exam and yearly mammography.  I will be glad to see Chava again at any point if the need arises but as of now we are making no further routine appointments for her here.   Yaelis Scharfenberg C     10/30/2013

## 2014-05-26 ENCOUNTER — Ambulatory Visit (HOSPITAL_COMMUNITY): Payer: 59 | Attending: Cardiology

## 2014-05-26 ENCOUNTER — Ambulatory Visit (INDEPENDENT_AMBULATORY_CARE_PROVIDER_SITE_OTHER): Payer: 59 | Admitting: Family Medicine

## 2014-05-26 ENCOUNTER — Other Ambulatory Visit: Payer: Self-pay

## 2014-05-26 ENCOUNTER — Encounter: Payer: Self-pay | Admitting: Family Medicine

## 2014-05-26 VITALS — BP 122/80 | HR 61 | Temp 97.9°F

## 2014-05-26 DIAGNOSIS — R079 Chest pain, unspecified: Secondary | ICD-10-CM | POA: Diagnosis not present

## 2014-05-26 DIAGNOSIS — R0789 Other chest pain: Secondary | ICD-10-CM

## 2014-05-26 MED ORDER — NITROGLYCERIN 0.4 MG/SPRAY TL SOLN: 1.0000 | Status: DC | PRN

## 2014-05-26 MED ORDER — NITROGLYCERIN 0.4 MG SL SUBL: 0.4000 mg | SUBLINGUAL_TABLET | SUBLINGUAL | Status: DC | PRN

## 2014-05-26 MED ORDER — OMEPRAZOLE 40 MG PO CPDR: 40.0000 mg | DELAYED_RELEASE_CAPSULE | Freq: Every day | ORAL | Status: DC

## 2014-05-26 MED ORDER — ASPIRIN 325 MG PO TABS
325.0000 mg | ORAL_TABLET | Freq: Every day | ORAL | Status: DC
  Administered 2014-05-26: 325 mg via ORAL

## 2014-05-26 MED ORDER — NITROGLYCERIN 0.4 MG SL SUBL
0.4000 mg | SUBLINGUAL_TABLET | SUBLINGUAL | Status: DC | PRN
  Administered 2014-05-26: 0.4 mg via SUBLINGUAL

## 2014-05-26 NOTE — Patient Instructions (Signed)
If chest pain returns, take one aspirin tablet, put one Nitroglycerin tablet under your tongue, and then call 911 to go to the Emergency room.  Don't let your daughter get under your skin.    Take one Omeprazole tablet a day to block stomach acid from hurting your esophagus and causing you pain.  Take omeprazole for 4 weeks to heal your esophagus.  Reflujo gastroesofgico - Adultos  (Gastroesophageal Reflux Disease, Adult)  El reflujo gastroesofgico ocurre cuando el cido del estmago pasa al esfago. Cuando el cido entra en contacto con el esfago, el cido provoca dolor (inflamacin) en el esfago. Con el tiempo, pueden formarse pequeos agujeros (lceras) en el revestimiento del esfago. CAUSAS   Exceso de Engineer, site. Esto aplica presin [employer], lo que hace que el cido del estmago suba hacia el esfago.  El hbito de fumar [place] la produccin de cido en el [place].  El consumo de alcohol. Provoca disminucin de la presin en el esfnter esofgico inferior (vlvula o anillo de msculo entre el esfago y Product manager), permitiendo que el cido del estmago suba hacia el esfago.  Cenas a ltima hora del da y estmago lleno. Aumenta la presin y la produccin de cido en el estmago.  Malformacin en el esfnter esofgico inferior. A menudo no se halla causa.  SNTOMAS   Ardor y Aeronautical engineer parte inferior del pecho detrs del esternn y en la zona media del [place]. Puede ocurrir [employer] por semana o ms a menudo.  Dificultad para tragar.  Dolor de Investment banker, operational.  Tos seca.  Sntomas similares al asma que incluyen sensacin de opresin en el pecho, falta de aire y sibilancias. DIAGNSTICO  El mdico diagnosticar el problema basndose en los sntomas. En algunos casos, se indican radiografas y otras pruebas para verificar si hay complicaciones o para comprobar el estado del estmago y Education administrator.  TRATAMIENTO  El mdico le indicar medicamentos de venta libre o  recetados para ayudar a disminuir la produccin de cido. Consulte con su mdico antes de Art gallery manager o agregar cualquier medicamento nuevo.  INSTRUCCIONES PARA EL CUIDADO EN EL HOGAR   Modifique los factores que pueda cambiar. Consulte con su mdico para solicitar orientacin relacionada con la prdida de peso, dejar de fumar y el consumo de alcohol.  Evite las comidas y bebidas que empeoran los [place], [place]:  [name] con cafena o alcohlicas.  Chocolate.  Sabores a English as a second language teacher.  Ajo y cebolla.  Comidas muy condimentadas.  Ctricos como naranjas, limones o limas.  Alimentos que contengan tomate, como salsas, [place] y pizza.  Alimentos fritos y Radio broadcast assistant.  Evite acostarse durante 3 horas antes de irse a dormir o antes de tomar una siesta.  Haga comidas pequeas durante Psychiatrist de 3 comidas abundantes.  Use ropas sueltas. No use nada apretado alrededor de la cintura que cause presin en el estmago.  Levante (eleve) la cabecera de la cama 6 a 8 pulgadas (15 a 20 cm) con bloques de madera. Usar almohadas extra no ayuda.  Solo tome medicamentos que se pueden comprar sin receta o recetados para el dolor, Tree surgeon o fiebre, como le indica el mdico.  No tome aspirina, ibuprofeno ni antiinflamatorios no esteroides. [place] [employer] SI:   [employer], el cuello, la [place], los dientes o la espalda.  El dolor aumenta o cambia la intensidad o la durancin.  Tiene nuseas, vmitos o sudoracin(diaforesis).  Siente falta de aire o dolor en el  pecho, o se desmaya.  Vomita y el vmito tiene [place], es de color [place], [place], negro o es similar a la borra del caf o tiene [place].  Las heces son rojas, sanguinolentas o negras. Estos sntomas pueden ser signos de otros problemas, como enfermedades cardacas, hemorragias gstrias o sangrado esofgico.  ASEGRESE DE QUE:   Comprende estas instrucciones.  Controlar su enfermedad.  Solicitar  ayuda de inmediato si no mejora o si empeora. Document Released: 10/03/2004 Document Revised: 03/18/2011 [facility] Patient Information 2015 [place]. This information is not intended to replace advice given to you by your health care provider. Make sure you discuss any questions you have with your health care provider.

## 2014-05-26 NOTE — Assessment & Plan Note (Addendum)
Acute problem Undifferentiated Resolved EKG: Sinus Brady (HR 59 BPM), otherwise normal EKG.   HEART score without troponin parameter = 1.  Low risk MACE  Will treat as an Upper GI issue with omeprazole 40 mg daily for 4 weeks  If pain returns, NTG x 1 & aspirin tablet then call 911 for immediate evaluation and transport to ED  RTC in 2 weeks for follow up.  Consideration of CAD risk stratification with ETT.  Check lipids, CBG

## 2014-05-26 NOTE — Progress Notes (Signed)
   Subjective:    Patient ID: Karen Bullock, female    DOB: 1965-10-16, 49 y.o.   MRN: [id number]  HPI   Patient presented with her husband with acute onset of chest pain late this morning  Location: Started in mid thoracic back with sharp pain that migrated to substernal location  Quality: throbbing SSCP  Duration: 1-2 hours  Onset (rest, exertion): rest Radiation: No radiation into Jaw nor arms  Better with: 95% relief of discomfort afterNTG SL x 1 in office visit within 5 to 10 minutes  Worse with: Leaning forward  Symptoms History of Trauma/lifting: no  Nausea/vomiting: no  Diaphoresis: no  Shortness of breath: no  Pleuritic: no  Cough: no  Edema: no  Orthopnea: no  Palpitations: no  Syncope: no  Indigestion: no   Red Flags History of CVD: no Worse with exertion: no  Recent Immobility: no  Cancer history: yes, but declared Cancer free byOncology bc over 5 years without evidence of recurrence  Tearing/radiation to back: no   History of intermittent substernal chest pain with high stressful personal interactions.  Karen Bullock had recent negative emotional interaction with her daughter recently.   No smoking  Review of Systems     Objective:   Physical Exam VS reviewed GEN: Alert, Cooperative, Groomed, mild distress initially that was resolved by end of visit.  COR: RRR, No Murmur, No gallup upright nor leaning forward, No JVD, Normal PMI size and location, mild TPP of parasternal region. LUNGS: BCTA, No Acc mm use, speaking in full sentences ABDOMEN: (+)BS, soft, NT, ND, No HSM, No palpable masses EXT: No peripheral leg edema. (-) [place], Equal calf circumferences bilateral. Tender to palpation of left parathoracic muscle bundle.  SKIN: No lesion nor rashes on back or trunk Gait: Normal speed, No significant path deviation,  Psych: Initially slightly anxious then normal affect by end of visit        Assessment & Plan:

## 2014-06-02 ENCOUNTER — Other Ambulatory Visit: Payer: Self-pay | Admitting: Oncology

## 2014-06-02 DIAGNOSIS — Z853 Personal history of malignant neoplasm of breast: Secondary | ICD-10-CM

## 2014-06-10 ENCOUNTER — Ambulatory Visit: Payer: PRIVATE HEALTH INSURANCE | Admitting: Family Medicine

## 2014-06-30 ENCOUNTER — Ambulatory Visit
Admission: RE | Admit: 2014-06-30 | Discharge: 2014-06-30 | Disposition: A | Discharge status: Home / Self Care | Payer: PRIVATE HEALTH INSURANCE | Source: Ambulatory Visit | Attending: Oncology | Admitting: Oncology

## 2014-06-30 DIAGNOSIS — Z853 Personal history of malignant neoplasm of breast: Secondary | ICD-10-CM

## 2014-10-07 ENCOUNTER — Ambulatory Visit (INDEPENDENT_AMBULATORY_CARE_PROVIDER_SITE_OTHER): Payer: 59 | Admitting: Family Medicine

## 2014-10-07 ENCOUNTER — Encounter: Payer: Self-pay | Admitting: Family Medicine

## 2014-10-07 VITALS — BP 115/68 | HR 145 | Temp 103.4°F | Wt 146.8 lb

## 2014-10-07 DIAGNOSIS — N390 Urinary tract infection, site not specified: Secondary | ICD-10-CM | POA: Diagnosis not present

## 2014-10-07 DIAGNOSIS — R300 Dysuria: Secondary | ICD-10-CM | POA: Diagnosis not present

## 2014-10-07 DIAGNOSIS — R509 Fever, unspecified: Secondary | ICD-10-CM | POA: Diagnosis not present

## 2014-10-07 LAB — POCT URINALYSIS DIPSTICK
Glucose, UA: NEGATIVE
Ketones, UA: 15
NITRITE UA: POSITIVE
Spec Grav, UA: 1.02
UROBILINOGEN UA: 4
pH, UA: 7

## 2014-10-07 LAB — POCT UA - MICROSCOPIC ONLY

## 2014-10-07 MED ORDER — IBUPROFEN 200 MG PO TABS
400.0000 mg | ORAL_TABLET | Freq: Once | ORAL | Status: AC
  Administered 2014-10-07: 400 mg via ORAL

## 2014-10-07 MED ORDER — OSELTAMIVIR PHOSPHATE 75 MG PO CAPS: 75.0000 mg | ORAL_CAPSULE | Freq: Two times a day (BID) | ORAL | Status: DC

## 2014-10-07 MED ORDER — PROMETHAZINE HCL 25 MG PO TABS: 25.0000 mg | ORAL_TABLET | Freq: Four times a day (QID) | ORAL | Status: DC | PRN

## 2014-10-07 MED ORDER — CIPROFLOXACIN HCL 500 MG PO TABS: 500.0000 mg | ORAL_TABLET | Freq: Two times a day (BID) | ORAL | Status: DC

## 2014-10-07 NOTE — Assessment & Plan Note (Signed)
Presence of myalgia. Tachycardia due to fever and mild dehydration. Patient has not gotten her flu shot. ?? influenza infection in the presence of generalized body aches. Ibuprofen given during this visit with no improvement after few min. We are unable to perform rapid influenza test at this facility hence I will treat her with Tamiflu since symptoms started less than 72 hrs ago. Continue Ibuprofen as needed for pain and fever. Phenergan prescribed prn N/V. Patient advised to go to the ED today if no improvement. She verbalized understanding.

## 2014-10-07 NOTE — Assessment & Plan Note (Signed)
Urinalysis suggestive of UTI. Urine sent for culture. I prescribed ciprofloxacin pending culture report. Keep hydrated as much as possible. I will contact her with culture report.

## 2014-10-07 NOTE — Patient Instructions (Signed)
Fiebre en el adulto  (Fever, Adult)  Se considera fiebre a la temperatura de 100.61F (38C) o ms.  CUIDADOS EN EL HOGAR   Tome los [employer] indic el mdico. No  tome aspirina para bajar la fiebre si tiene menos de 19 aos.  Si le han recetado antibiticos, tmelos segn las indicaciones. Finalice el [place], aunque comience a Sports administrator.  Haga reposo.  Beba gran cantidad de lquido para mantener el pis (orina) de tono claro o amarillo plido. No consuma alcohol.  Tome un bao o una ducha con agua a Engineer, water. No use agua con hielo ni se pase esponjas con alcohol.  Use ropas ligeras y sueltas. SOLICITE AYUDA DE INMEDIATO SI:   Siente falta de aire o dificultad para respirar.  Se siente muy dbil.  Se siente mareado o se desvanece (se desmaya).  Tiene mucha sed u observa que orina poco o no orina en absoluto.  Siente un dolor nuevo.  Vomita o tiene deposiciones acuosas (diarrea).  Sigue vomitando o tiene diarrea durante ms de 1 o 2 das.  Siente rigidez en el cuello o le [employer].  Le aparece una erupcin cutnea.  Tiene fiebre o problemas (sntomas) que persisten durante ms de 2 o 3 das.  Tiene fiebre y los sntomas empeoran rpidamente.  Sigue vomitando los lquidos que ingiere.  No mejora luego de 3 das.  Tiene nuevos sntomas. ASEGRESE DE QUE:   Comprende estas instrucciones.  Controlar su enfermedad.  Solicitar ayuda de inmediato si no mejora o si empeora. Document Released: 09/18/2011 [facility] Patient Information 2015 [place]. This information is not intended to replace advice given to you by your health care provider. Make sure you discuss any questions you have with your health care provider.

## 2014-10-07 NOTE — Progress Notes (Signed)
Subjective:     Patient ID: Karen Bullock, female   DOB: [birth year], 49 y.o.   MRN: [id number]  Fever  This is a new problem. The current episode started yesterday. The problem occurs constantly. The problem has been gradually worsening. Her temperature was unmeasured prior to arrival. Associated symptoms include headaches, nausea, urinary pain and vomiting. Pertinent negatives include no abdominal pain, chest pain, congestion, coughing, diarrhea or sore throat. Associated symptoms comments: She has generalized body aches, also mild belly pain with excessive retching. She has tried NSAIDs for the symptoms. The treatment provided moderate relief.  No sick contact.   Current Outpatient Prescriptions on File Prior to Visit  Medication Sig Dispense Refill  . nitroGLYCERIN (NITROSTAT) 0.4 MG SL tablet Place 1 tablet (0.4 mg total) under the tongue every 5 (five) minutes as needed for chest pain. 25 tablet 0  . omeprazole (PRILOSEC) 40 MG capsule Take 1 capsule (40 mg total) by mouth daily. 28 capsule 0   No current facility-administered medications on file prior to visit.   Past Medical History  Diagnosis Date  . Breast cancer, right breast 01/15/2011  . Cancer   . Dislocation of left shoulder joint       Review of Systems  Constitutional: Positive for fever.  HENT: Negative for congestion and sore throat.   Respiratory: Negative for cough.   Cardiovascular: Negative for chest pain.  Gastrointestinal: Positive for nausea and vomiting. Negative for abdominal pain and diarrhea.  Genitourinary: Positive for dysuria.  Neurological: Positive for headaches.       Objective:   Physical Exam  Constitutional: She is oriented to person, place, and time. She appears well-developed. No distress.  HENT:  Head: Normocephalic.  Right Ear: Tympanic membrane, external ear and ear canal normal.  Left Ear: Tympanic membrane, external ear and ear canal normal.  Mouth/Throat: Oropharynx is clear and moist  and mucous membranes are normal.  Eyes: Conjunctivae and EOM are normal. Pupils are equal, round, and reactive to light.  Neck: Neck supple. No rigidity. No Brudzinski's sign and no Kernig's sign noted.  Cardiovascular: S1 normal, S2 normal and normal heart sounds.  Tachycardia present.   No murmur heard. Pulmonary/Chest: Effort normal and breath sounds normal. No respiratory distress. She has no wheezes. She has no rhonchi. She has no rales.  Abdominal: Soft. Bowel sounds are normal. She exhibits no distension and no mass. There is no tenderness.  Musculoskeletal: Normal range of motion. She exhibits no edema.  Lymphadenopathy:    She has no cervical adenopathy.    She has no axillary adenopathy.       Right: No supraclavicular adenopathy present.       Left: No supraclavicular adenopathy present.  Neurological: She is alert and oriented to person, place, and time.  Nursing note and vitals reviewed.      Assessment:     Fever Dysuria: UTI     Plan:     Check problem list.

## 2014-10-09 ENCOUNTER — Encounter (HOSPITAL_COMMUNITY): Payer: Self-pay | Admitting: Emergency Medicine

## 2014-10-09 ENCOUNTER — Emergency Department (HOSPITAL_COMMUNITY)
Admission: EM | Admit: 2014-10-09 | Discharge: 2014-10-09 | Disposition: A | Discharge status: Home / Self Care | Payer: 59 | Attending: Emergency Medicine | Admitting: Emergency Medicine

## 2014-10-09 DIAGNOSIS — R000 Tachycardia, unspecified: Secondary | ICD-10-CM | POA: Diagnosis not present

## 2014-10-09 DIAGNOSIS — N12 Tubulo-interstitial nephritis, not specified as acute or chronic: Secondary | ICD-10-CM | POA: Insufficient documentation

## 2014-10-09 DIAGNOSIS — Z79899 Other long term (current) drug therapy: Secondary | ICD-10-CM | POA: Insufficient documentation

## 2014-10-09 DIAGNOSIS — E860 Dehydration: Secondary | ICD-10-CM | POA: Diagnosis not present

## 2014-10-09 DIAGNOSIS — Z792 Long term (current) use of antibiotics: Secondary | ICD-10-CM | POA: Diagnosis not present

## 2014-10-09 DIAGNOSIS — Z87440 Personal history of urinary (tract) infections: Secondary | ICD-10-CM | POA: Diagnosis not present

## 2014-10-09 DIAGNOSIS — R531 Weakness: Secondary | ICD-10-CM | POA: Diagnosis present

## 2014-10-09 DIAGNOSIS — Z87828 Personal history of other (healed) physical injury and trauma: Secondary | ICD-10-CM | POA: Insufficient documentation

## 2014-10-09 DIAGNOSIS — Z853 Personal history of malignant neoplasm of breast: Secondary | ICD-10-CM | POA: Insufficient documentation

## 2014-10-09 LAB — URINALYSIS, ROUTINE W REFLEX MICROSCOPIC
Bilirubin Urine: NEGATIVE
Glucose, UA: NEGATIVE mg/dL
KETONES UR: 15 mg/dL — AB
Nitrite: NEGATIVE
PH: 7 (ref 5.0–8.0)
Protein, ur: 100 mg/dL — AB
Specific Gravity, Urine: 1.014 (ref 1.005–1.030)
Urobilinogen, UA: 1 mg/dL (ref 0.0–1.0)

## 2014-10-09 LAB — CBC WITH DIFFERENTIAL/PLATELET
BASOS ABS: 0 10*3/uL (ref 0.0–0.1)
Basophils Relative: 0 %
EOS PCT: 0 %
Eosinophils Absolute: 0 10*3/uL (ref 0.0–0.7)
HEMATOCRIT: 35 % — AB (ref 36.0–46.0)
HEMOGLOBIN: 12 g/dL (ref 12.0–15.0)
LYMPHS ABS: 0.8 10*3/uL (ref 0.7–4.0)
LYMPHS PCT: 10 %
MCH: 29.5 pg (ref 26.0–34.0)
MCHC: 34.3 g/dL (ref 30.0–36.0)
MCV: 86 fL (ref 78.0–100.0)
Monocytes Absolute: 0.6 10*3/uL (ref 0.1–1.0)
Monocytes Relative: 7 %
NEUTROS ABS: 6.9 10*3/uL (ref 1.7–7.7)
NEUTROS PCT: 83 %
Platelets: 174 10*3/uL (ref 150–400)
RBC: 4.07 MIL/uL (ref 3.87–5.11)
RDW: 14.3 % (ref 11.5–15.5)
WBC: 8.3 10*3/uL (ref 4.0–10.5)

## 2014-10-09 LAB — URINE MICROSCOPIC-ADD ON

## 2014-10-09 LAB — COMPREHENSIVE METABOLIC PANEL
ALT: 34 U/L (ref 14–54)
AST: 35 U/L (ref 15–41)
Albumin: 3 g/dL — ABNORMAL LOW (ref 3.5–5.0)
Alkaline Phosphatase: 119 U/L (ref 38–126)
Anion gap: 10 (ref 5–15)
BILIRUBIN TOTAL: 1.5 mg/dL — AB (ref 0.3–1.2)
BUN: 14 mg/dL (ref 6–20)
CHLORIDE: 96 mmol/L — AB (ref 101–111)
CO2: 24 mmol/L (ref 22–32)
CREATININE: 1.08 mg/dL — AB (ref 0.44–1.00)
Calcium: 8.8 mg/dL — ABNORMAL LOW (ref 8.9–10.3)
GFR calc non Af Amer: 59 mL/min — ABNORMAL LOW (ref >60)
Glucose, Bld: 127 mg/dL — ABNORMAL HIGH (ref 65–99)
Potassium: 3.6 mmol/L (ref 3.5–5.1)
Sodium: 130 mmol/L — ABNORMAL LOW (ref 135–145)
Total Protein: 7.1 g/dL (ref 6.5–8.1)

## 2014-10-09 LAB — I-STAT CG4 LACTIC ACID, ED: Lactic Acid, Venous: 0.77 mmol/L (ref 0.5–2.0)

## 2014-10-09 LAB — LIPASE, BLOOD: Lipase: 10 U/L — ABNORMAL LOW (ref 22–51)

## 2014-10-09 MED ORDER — SODIUM CHLORIDE 0.9 % IV BOLUS (SEPSIS)
1000.0000 mL | Freq: Once | INTRAVENOUS | Status: AC
  Administered 2014-10-09: 1000 mL via INTRAVENOUS

## 2014-10-09 MED ORDER — ONDANSETRON HCL 4 MG/2ML IJ SOLN
4.0000 mg | Freq: Once | INTRAMUSCULAR | Status: AC
  Administered 2014-10-09: 4 mg via INTRAVENOUS
  Filled 2014-10-09: qty 2

## 2014-10-09 NOTE — ED Provider Notes (Signed)
CSN: [id number]     Arrival date & time 10/09/14  0603 History   First MD Initiated Contact with Patient 10/09/14 [id number]     Chief Complaint  Patient presents with  . Fever  . Chills  . Weakness  . Emesis     (Consider location/radiation/quality/duration/timing/severity/associated sxs/prior Treatment) HPI Comments: Patient is a 49 year old female who is otherwise healthy and presents today for a 5 day history of persistent fever, headache, back pain, body aches, nausea and vomiting. This started abruptly 5 days ago. She was seen at her PCP office 2 days ago and at that time diagnosed with a UTI and possibly the flu. They do not have the flu tests that could be done in the office so they preemptively treated her with Tamiflu, Cipro and promethazine.  Patient has been taking the medications as prescribed however continues to have fever, nausea, vomiting and body aches. Her headache is the worst when her fever is elevated and improves when her fever goes down with Tylenol. She denies any significant abdominal pain. No recent travel or other sick contacts. No cough, congestion or rashes.  Patient denies any dysuria, hematuria or vaginal discharge.  Patient is a 49 y.o. female presenting with fever, weakness, and vomiting.  Fever Max temp prior to arrival:  102 Temp source:  Oral Severity:  Severe Onset quality:  Gradual Duration:  5 days Timing:  Constant Progression:  Worsening Chronicity:  New Relieved by:  Nothing Worsened by:  Nothing tried Ineffective treatments:  Acetaminophen Associated symptoms: chills, headaches, myalgias, nausea and vomiting   Associated symptoms: no chest pain, no congestion, no cough, no diarrhea and no dysuria   Associated symptoms comment:  Back pain Weakness Associated symptoms include headaches. Pertinent negatives include no chest pain.  Emesis Associated symptoms: chills, headaches and myalgias   Associated symptoms: no diarrhea     Past Medical  History  Diagnosis Date  . Breast cancer, right breast ([place]) 01/15/2011  . Cancer ([place])   . Dislocation of left shoulder joint    Past Surgical History  Procedure Laterality Date  . Breast surgery  2010    lumpectomy   Family History  Problem Relation Age of Onset  . Diabetes Father   . Cancer Neg Hx   . Hypertension Neg Hx   . Heart disease Neg Hx    Social History  Substance Use Topics  . Smoking status: Never Smoker   . Smokeless tobacco: None  . Alcohol Use: No   OB History    No data available     Review of Systems  Constitutional: Positive for fever and chills.  HENT: Negative for congestion.   Respiratory: Negative for cough.   Cardiovascular: Negative for chest pain.  Gastrointestinal: Positive for nausea and vomiting. Negative for diarrhea.  Genitourinary: Negative for dysuria.  Musculoskeletal: Positive for myalgias.  Neurological: Positive for weakness and headaches.  All other systems reviewed and are negative.     Allergies  Review of patient's allergies indicates no known allergies.  Home Medications   Prior to Admission medications   Medication Sig Start Date End Date Taking? Authorizing Provider  ciprofloxacin (CIPRO) 500 MG tablet Take 1 tablet (500 mg total) by mouth 2 (two) times daily. 10/07/14   Kinnie Feil, MD  nitroGLYCERIN (NITROSTAT) 0.4 MG SL tablet Place 1 tablet (0.4 mg total) under the tongue every 5 (five) minutes as needed for chest pain. 05/26/14   Blane Ohara McDiarmid, MD  omeprazole (PRILOSEC) 40 MG  capsule Take 1 capsule (40 mg total) by mouth daily. 05/26/14 06/24/14  Blane Ohara McDiarmid, MD  oseltamivir (TAMIFLU) 75 MG capsule Take 1 capsule (75 mg total) by mouth 2 (two) times daily. 10/07/14   Kinnie Feil, MD  promethazine (PHENERGAN) 25 MG tablet Take 1 tablet (25 mg total) by mouth every 6 (six) hours as needed for nausea or vomiting. 10/07/14   Kinnie Feil, MD   BP 107/64 mmHg  Pulse 97  Temp(Src) 101.5 F (38.6 C)  (Oral)  Resp 16  Ht 5\' 2"  (1.575 m)  Wt 146 lb (66.225 kg)  BMI 26.70 kg/m2  SpO2 96%  LMP 07/08/2011 Physical Exam  Constitutional: She is oriented to person, place, and time. She appears well-developed and well-nourished. No distress.  HENT:  Head: Normocephalic and atraumatic.  Mouth/Throat: Oropharynx is clear and moist. Mucous membranes are dry.  Eyes: Conjunctivae and EOM are normal. Pupils are equal, round, and reactive to light.  Neck: Normal range of motion. Neck supple.  Cardiovascular: Regular rhythm and intact distal pulses.  Tachycardia present.   No murmur heard. Pulmonary/Chest: Effort normal and breath sounds normal. No respiratory distress. She has no wheezes. She has no rales.  Abdominal: Soft. She exhibits no distension. There is no tenderness. There is CVA tenderness. There is no rebound and no guarding.  Musculoskeletal: Normal range of motion. She exhibits no edema or tenderness.  Neurological: She is alert and oriented to person, place, and time.  Skin: Skin is warm and dry. No rash noted. No erythema.  Psychiatric: She has a normal mood and affect. Her behavior is normal.  Nursing note and vitals reviewed.   ED Course  Procedures (including critical care time) Labs Review Labs Reviewed  CBC WITH DIFFERENTIAL/PLATELET - Abnormal; Notable for the following:    HCT 35.0 (*)    All other components within normal limits  COMPREHENSIVE METABOLIC PANEL - Abnormal; Notable for the following:    Sodium 130 (*)    Chloride 96 (*)    Glucose, Bld 127 (*)    Creatinine, Ser 1.08 (*)    Calcium 8.8 (*)    Albumin 3.0 (*)    Total Bilirubin 1.5 (*)    GFR calc non Af Amer 59 (*)    All other components within normal limits  LIPASE, BLOOD - Abnormal; Notable for the following:    Lipase 10 (*)    All other components within normal limits  URINALYSIS, ROUTINE W REFLEX MICROSCOPIC (NOT AT [facility]) - Abnormal; Notable for the following:    APPearance CLOUDY (*)     Hgb urine dipstick LARGE (*)    Ketones, ur 15 (*)    Protein, ur 100 (*)    Leukocytes, UA TRACE (*)    All other components within normal limits  URINE MICROSCOPIC-ADD ON  I-STAT CG4 LACTIC ACID, ED    Imaging Review No results found. I have personally reviewed and evaluated these images and lab results as part of my medical decision-making.   EKG Interpretation None      MDM   Final diagnoses:  Pyelonephritis  Dehydration   Patient is a 49 year old otherwise healthy female with now 5 days of ongoing fever, nausea, vomiting and body aches. She was seen by her PCP 2 days ago noted that time given Cipro, promethazine and Tamiflu. Lab done on 10/07/2014 showed a normal CBC, CMP and a UA with too numerous to count white blood cells nitrite and leukocyte positive.  Patient  was also given Tamiflu at that time for concern for flu and she has not received her flu shot. However patient does not have cough, congestion, shortness of breath or any URI type symptoms more typical of flu.  Concerned that patient may have pyelonephritis which is not responding to Cipro. Low suspicion for kidney stone at this time. She has no notable abdominal tenderness concerning for cholecystitis, appendicitis, pancreatitis or diverticulitis.  Currently urine culture is pending however will do a repeat UA, CBC, CMP, lipase, lactic acid. Patient given Tylenol for her fever of 101.5 today as well as IV fluids and Zofran.  8:43 AM Patient's CBC is within normal limits. Lipase and lactate are also within normal limits. CMP with mild elevation of creatinine today and UA with ketones but significant improvement in overall evidence of UTI. She no longer has nitrite positive and white blood cells are 0-2. Feel like the Cipro is most likely helping. After IV fluids patient feels much better. Will give a second liter of fluid and by mouth challenge patient  10:07 AM Pt feeling much better.  Drank a whole cup of fluid and  no further nausea or vomiting.  Blanchie Dessert, MD 10/09/14 1008

## 2014-10-09 NOTE — Discharge Instructions (Signed)
Deshidratacin, Adulto (Dehydration, Adult) Se denomina deshidratacin cuando se pierden ms lquidos de los que se ingieren. Los rganos vitales como los riones, el cerebro y el corazn no pueden funcionar sin la adecuada cantidad de lquidos y sales. Cualquier prdida de lquidos del organismo puede causar deshidratacin.  CAUSAS  Vmitos.  Diarrea.  Sudoracin excesiva.  Eliminacin excesiva de orina.  Fiebre. SNTOMAS Deshidratacin leve  Sed.  Labios resecos.  Sequedad leve de la mucosa bucal. Deshidratacin moderada   La boca est muy seca.  Ojos hundidos.  La piel no vuelve rpidamente a su lugar cuando se suelta luego de pellizcarla ligeramente.  Orina oscura y disminucin de la produccin de orina.  Disminucin de la cantidad de lgrimas.  Dolor de cabeza. Deshidratacin grave  La boca est muy seca.  Sed extrema.  Pulso rpido y dbil (ms de 100 por minuto en reposo).  Manos y pies fros.  Prdida de la capacidad para transpirar, independientemente del calor y la temperatura.  Respiracin rpida.  Labios azulados.  Confusin y aletargamiento.  Dificultad para despertarse.  Poca produccin de orina.  Falta de lgrimas. DIAGNSTICO El profesional diagnosticar deshidratacin basndose en los sntomas y el examen fsico. Las pruebas de sangre y orina ayudarn a confirmar el diagnstico. La evaluacin diagnstica suele identificar tambin las causas de la deshidratacin. TRATAMIENTO El tratamiento de la deshidratacin leve o moderada generalmente puede hacerse en el hogar mediante el aumento de la cantidad de los lquidos que se beben. Es mejor beber pequeas cantidades de lquidos con mayor frecuencia. Beber demasiado de una sola vez puede hacer que el vmito empeore. Siga las instrucciones para el cuidado en el hogar que se indican a continuacin.  La deshidratacin grave debe tratarse en el hospital en el que probablemente le administren  lquidos por va intravenosa (IV) que contienen agua y electrolitos.  INSTRUCCIONES PARA EL CUIDADO DOMICILIARIO  Pida instrucciones especficas a su mdico con respecto a la rehidratacin.  Debe ingerir gran cantidad de lquido para mantener la orina de tono claro o color amarillo plido.  Beba pequeas cantidades con frecuencia si tiene nuseas y vmitos.  Alimntese como lo hace normalmente.  Evite:  Alimentos o bebidas que contengan mucha azcar.  Gaseosas.  Jugos.  Lquidos muy calientes o fros.  Bebidas con cafena.  Alimentos muy grasos.  Alcohol.  Tabaco.  Comer demasiado a la vez.  Postres de gelatina.  Lave bien sus manos para evitar las bacterias o los virus se diseminen.  Slo tome medicamentos de venta libre o recetados para calmar el dolor, las molestias o bajar la fiebre segn las indicaciones de su mdico.  Consulte a su mdico si puede seguir tomando todos sus medicamentos recetados o de venta libre.  Concurra puntualmente a las citas de control con el mdico. SOLICITE ATENCIN MDICA SI:  Tiene dolor abdominal y este aumenta o permanece en un rea (se localiza).  Aparece una erupcin, rigidez en el cuello o dolor de cabeza intensos.  Est irritable, somnoliento o le cuesta despertarse.  Se siente dbil, mareado tiene mucha sed. SOLICITE ATENCIN MDICA DE INMEDIATO SI:  No puede retener lquidos o empeora a pesar del tratamiento.  Tiene episodios frecuentes de vmitos o diarrea.  Observa sangre o una sustancia verde (bilis) en el vmito.  Observa sangre en la materia fecal, o las heces son negras y de aspecto alquitranado.  No ha orinado durante 6 a 8 horas, o slo ha orinado una cantidad pequea de orina oscura.  Tiene fiebre.    Se desmaya. ASEGRESE QUE:  Comprende estas instrucciones.  Controlar su enfermedad.  Solicitar ayuda inmediatamente si no mejora o si empeora. Document Released: 12/24/2004 Document Revised:  03/18/2011 [employer] Patient Information 2015 [employer]. This information is not intended to replace advice given to you by your health care provider. Make sure you discuss any questions you have with your health care provider.  

## 2014-10-09 NOTE — ED Notes (Addendum)
Pt family states she's here due to fever and cold chills and sweats. Pt has tamilflu, cipro, and promethazine. Pt states she has been taking them as prescribed, denies missing doses. Prescriptions filled 10/07/14. N\/V   Pt reports headache. Pain 9/10  101.5 oral 11/676 95%  100 HR

## 2014-10-10 ENCOUNTER — Telehealth: Payer: Self-pay | Admitting: Family Medicine

## 2014-10-10 LAB — URINE CULTURE: Colony Count: >=100000

## 2014-10-10 NOTE — Telephone Encounter (Signed)
Message left to call back about test result. Interpreter id#: [phone] Glen[place] Haring)   In case she calls back. 1. Urine culture shows Ecoli but sensitive to the antibiotic given. 2. If still symptomatic to see [place] back soon.

## 2014-10-17 ENCOUNTER — Encounter: Payer: Self-pay | Admitting: Internal Medicine

## 2014-10-17 ENCOUNTER — Ambulatory Visit (INDEPENDENT_AMBULATORY_CARE_PROVIDER_SITE_OTHER): Payer: 59 | Admitting: Internal Medicine

## 2014-10-17 VITALS — BP 103/62 | HR 64 | Temp 97.7°F | Wt 143.0 lb

## 2014-10-17 DIAGNOSIS — Z131 Encounter for screening for diabetes mellitus: Secondary | ICD-10-CM | POA: Diagnosis not present

## 2014-10-17 DIAGNOSIS — Z87440 Personal history of urinary (tract) infections: Secondary | ICD-10-CM | POA: Diagnosis not present

## 2014-10-17 DIAGNOSIS — Z87448 Personal history of other diseases of urinary system: Secondary | ICD-10-CM

## 2014-10-17 NOTE — Patient Instructions (Signed)
Seems like you are doing better after the antibiotics. Please make a lab visit appointment to get your blood work done. Make sure you are fasting when you come in for this blood work. We are getting this blood work done to see if you have diabetes.

## 2014-10-17 NOTE — Progress Notes (Signed)
Patient ID: Karen Bullock, female   DOB: 1965-10-18, 49 y.o.   MRN: [id number]   Subjective:   CC: ED follow up visit  HPI:  Patient was seen in ED on 10/09/14 for a 5 day history of fever, HA, backpain, body aches, N/V. She was seen at clinic prior to this on 9/30 and was diagnosed with UTI and possibly flu; She was empirially treated with Tamiflu, and was given Ciprofloxacin (for 7 days) and Promethazine for nausea. In the ED, patient stated she was taking medications but she continuned to have fever, nausea, vomiting, and body aches. On exam, she was noted to be febrile (101.54F) with dry MM, tachycardic, and had +CVA tenderness. CBC, lipase and lactate were wnl. CMP showed mild elevation to Cr and UA with ketones but significant improvement in overall evidence of UTI. Patient's symptoms improved with IVF and she passed PO challenge in ED and was discharged. Urine culture showed >100,000 E Coli susceptible to Ciprofloxacin.   Today patient's symptoms have resolved. Denies fevers, chills, dysuria, N/V. She completed her 7 day antibiotic course. Her PO intake is back to normal. Notes of "little" back pain bilaterally. Patient also notes of increased thirst since she became sick; this has not resolved as she got better. She also notes of her eye lids feeling heavy. On ROS, notes of polyuria, denies polyphagia.   Review of Systems - Per HPI.  PMH, FH, or SH Smoking status: none    Objective:  Physical Exam LMP 07/08/2011 GEN: NAD CV: RRR, no murmurs, rubs, or gallops; no JVD PULM: CTAB, normal effort Flank: no CVA tenderness but some low lumbar paraspinal muscle tenderness SKIN: No rash or cyanosis; warm and well-perfused EXTR: No lower extremity edema or calf tenderness PSYCH: Mood and affect euthymic, normal rate and volume of speech NEURO: Awake, alert, no focal deficits grossly, normal speech    Assessment:     Karen Bullock is a 50 y.o. female here for ED follow up after finishing treatment  (7 day Ciprofloxacin) for pyelonephritis. Symptoms resolved, however notes of polyuria and polydipsia since becoming sick. Per chart review, no recent screening for DM. Screening for DM is reasonable with patient's age, her weight (overweight per BMI), and symptoms of polydipsia/polyuria.     Plan:     # See problem list and after visit summary for problem-specific plans.  Screening for DM:  - ordered BMP as future order; asked patient to make lab visit and come in fasting   Recent History of Acute Pyelonephritis: Urine Cx showed >100,000 E Coli sensitive to Cipro - symptoms resolved after 7 day course of Ciprofloxacin - ED labs note of mildly increased Cr; thought to be due to dehydration - will check Cr with BMP future order  Follow-up: Follow up with PCP for well woman visit  Smiley Houseman, MD [place]

## 2014-10-18 ENCOUNTER — Other Ambulatory Visit: Payer: 59

## 2014-10-18 DIAGNOSIS — Z131 Encounter for screening for diabetes mellitus: Secondary | ICD-10-CM

## 2014-10-18 LAB — BASIC METABOLIC PANEL WITH GFR
BUN: 19 mg/dL (ref 7–25)
CHLORIDE: 103 mmol/L (ref 98–110)
CO2: 30 mmol/L (ref 20–31)
Calcium: 9.1 mg/dL (ref 8.6–10.4)
Creat: 0.83 mg/dL (ref 0.50–1.05)
GFR, EST NON AFRICAN AMERICAN: 82 mL/min (ref >=60)
GFR, Est African American: >89 mL/min (ref >=60)
GLUCOSE: 90 mg/dL (ref 65–99)
POTASSIUM: 4.6 mmol/L (ref 3.5–5.3)
SODIUM: 139 mmol/L (ref 135–146)

## 2014-10-18 NOTE — Progress Notes (Signed)
Bmp done today Karen Bullock 

## 2015-05-23 ENCOUNTER — Other Ambulatory Visit: Payer: Self-pay | Admitting: Oncology

## 2015-05-23 DIAGNOSIS — Z853 Personal history of malignant neoplasm of breast: Secondary | ICD-10-CM

## 2015-07-07 ENCOUNTER — Ambulatory Visit
Admission: RE | Admit: 2015-07-07 | Discharge: 2015-07-07 | Disposition: A | Discharge status: Home / Self Care | Payer: BLUE CROSS/BLUE SHIELD | Source: Ambulatory Visit | Attending: Oncology | Admitting: Oncology

## 2015-07-07 DIAGNOSIS — Z853 Personal history of malignant neoplasm of breast: Secondary | ICD-10-CM

## 2016-06-04 ENCOUNTER — Other Ambulatory Visit: Payer: Self-pay | Admitting: Oncology

## 2016-06-04 DIAGNOSIS — Z1231 Encounter for screening mammogram for malignant neoplasm of breast: Secondary | ICD-10-CM

## 2016-07-08 ENCOUNTER — Ambulatory Visit: Payer: BLUE CROSS/BLUE SHIELD

## 2016-07-08 ENCOUNTER — Ambulatory Visit
Admission: RE | Admit: 2016-07-08 | Discharge: 2016-07-08 | Disposition: A | Discharge status: Home / Self Care | Payer: BLUE CROSS/BLUE SHIELD | Source: Ambulatory Visit | Attending: Oncology | Admitting: Oncology

## 2016-07-08 DIAGNOSIS — Z1231 Encounter for screening mammogram for malignant neoplasm of breast: Secondary | ICD-10-CM

## 2017-05-29 ENCOUNTER — Other Ambulatory Visit: Payer: Self-pay | Admitting: Oncology

## 2017-05-29 DIAGNOSIS — Z1231 Encounter for screening mammogram for malignant neoplasm of breast: Secondary | ICD-10-CM

## 2017-07-14 ENCOUNTER — Ambulatory Visit
Admission: RE | Admit: 2017-07-14 | Discharge: 2017-07-14 | Disposition: A | Discharge status: Home / Self Care | Payer: BLUE CROSS/BLUE SHIELD | Source: Ambulatory Visit | Attending: Oncology | Admitting: Oncology

## 2017-07-14 DIAGNOSIS — Z1231 Encounter for screening mammogram for malignant neoplasm of breast: Secondary | ICD-10-CM | POA: Diagnosis not present

## 2017-07-14 HISTORY — DX: Personal history of irradiation: Z92.3

## 2017-07-14 HISTORY — DX: Personal history of antineoplastic chemotherapy: Z92.21

## 2017-07-15 ENCOUNTER — Encounter: Payer: Self-pay | Admitting: Family Medicine

## 2018-01-08 ENCOUNTER — Other Ambulatory Visit: Payer: Self-pay

## 2018-01-08 ENCOUNTER — Ambulatory Visit: Payer: BLUE CROSS/BLUE SHIELD | Admitting: Family Medicine

## 2018-01-08 ENCOUNTER — Encounter: Payer: Self-pay | Admitting: Family Medicine

## 2018-01-08 VITALS — BP 102/62 | HR 56 | Temp 98.0°F | Ht 65.0 in | Wt 161.0 lb

## 2018-01-08 DIAGNOSIS — R059 Cough, unspecified: Secondary | ICD-10-CM

## 2018-01-08 DIAGNOSIS — R05 Cough: Secondary | ICD-10-CM | POA: Diagnosis not present

## 2018-01-08 DIAGNOSIS — J209 Acute bronchitis, unspecified: Secondary | ICD-10-CM

## 2018-01-08 MED ORDER — METHYLPREDNISOLONE SODIUM SUCC 125 MG IJ SOLR
125.0000 mg | Freq: Once | INTRAMUSCULAR | Status: AC
  Administered 2018-01-08: 125 mg via INTRAMUSCULAR

## 2018-01-08 MED ORDER — ACETAMINOPHEN-CODEINE #3 300-30 MG PO TABS
1.0000 | ORAL_TABLET | Freq: Four times a day (QID) | ORAL | 0 refills | Status: DC | PRN
Start: 2018-01-08 — End: 2019-05-20

## 2018-01-08 MED ORDER — AZITHROMYCIN 250 MG PO TABS: ORAL_TABLET | ORAL | 0 refills | Status: DC

## 2018-01-08 NOTE — Patient Instructions (Addendum)
Take the tylenol #3 for cough You can take one tablet or two tablets every 6 hours as you need It may make you sleepy  Take the antibiotic 2 pills today and the one pill a day for 4 more days  Come back and follow up for this in 2-3 weeks I am also giving you a shot today for inflammation that should help with your cough

## 2018-01-09 NOTE — Progress Notes (Signed)
    CHIEF COMPLAINT / HPI: Visit was with use of video interpreter for Spanish.  2 weeks of cough.  Has not gotten any better.  Started with upper respiratory type infection with sore throat.  Cough is constant.  She gets in the long spells where she cannot stop coughing and sometimes has urinary incontinence, on 1 or 2 occasions has actually had emesis.  No shortness of breath.  Cough is nonproductive.  No fevers.  She does feel somewhat fatigued.  REVIEW OF SYSTEMS: See HPI  PERTINENT  PMH / PSH: I have reviewed the patient's medications, allergies, past medical and surgical history, smoking status and updated in the EMR as appropriate. History of breast cancer   OBJECTIVE:  Vital signs reviewed. GENERAL: Well-developed, well-nourished, no acute distress. CARDIOVASCULAR: Regular rate and rhythm no murmur gallop or rub LUNGS: Clear to auscultation bilaterally but with some expiratory wheeze bilaterally.. ABDOMEN: Soft positive bowel sounds NEURO: No gross focal neurological deficits. MSK: Movement of extremity x 4.    ASSESSMENT / PLAN:  Bronchitis with protracted cough.  Will treat with a azithromycin, gave her 1 dose of intramuscular steroids today due to her wheezing.  Tylenol 3 for cough.  Follow-up if not improving in 1 week.

## 2018-01-28 ENCOUNTER — Encounter: Payer: Self-pay | Admitting: Family Medicine

## 2018-01-28 ENCOUNTER — Other Ambulatory Visit: Payer: Self-pay

## 2018-01-28 ENCOUNTER — Ambulatory Visit (INDEPENDENT_AMBULATORY_CARE_PROVIDER_SITE_OTHER): Payer: BLUE CROSS/BLUE SHIELD | Admitting: Family Medicine

## 2018-01-28 VITALS — BP 98/72 | HR 58 | Temp 97.9°F | Ht 65.0 in | Wt 160.8 lb

## 2018-01-28 DIAGNOSIS — C50311 Malignant neoplasm of lower-inner quadrant of right female breast: Secondary | ICD-10-CM

## 2018-01-28 DIAGNOSIS — G933 Postviral fatigue syndrome: Secondary | ICD-10-CM

## 2018-01-28 DIAGNOSIS — G9331 Postviral fatigue syndrome: Secondary | ICD-10-CM

## 2018-01-28 MED ORDER — LORATADINE 10 MG PO TABS: 10.0000 mg | ORAL_TABLET | Freq: Every day | ORAL | 11 refills | Status: DC

## 2018-01-28 NOTE — Patient Instructions (Signed)
Your lung exam is back to normal so I do not think we need a chest x-ray.  I suspect your fatigue is related to the viral illness you had previously, but I want to check some blood work particularly with your history of breast cancer.  Also, we do not have any blood work on file for you since 2016.  I will send you a note in the mail about that.  I would like you to get back into regular follow-up with your regular doctor so please make an appointment to see her in the next few months.  I think the small cough that you are having now is related to allergies and I am calling in some Claritin for you.  Take 1 a day for the next 2 to 3 weeks and see if it improves your symptoms.  If not, you can stop it and then follow-up with your PCP  Glad to see you are feeling better!

## 2018-01-29 LAB — CBC
Hematocrit: 36.3 % (ref 34.0–46.6)
Hemoglobin: 12.4 g/dL (ref 11.1–15.9)
MCH: 29.7 pg (ref 26.6–33.0)
MCHC: 34.2 g/dL (ref 31.5–35.7)
MCV: 87 fL (ref 79–97)
PLATELETS: 251 10*3/uL (ref 150–450)
RBC: 4.17 x10E6/uL (ref 3.77–5.28)
RDW: 14.8 % (ref 11.7–15.4)
WBC: 7.1 10*3/uL (ref 3.4–10.8)

## 2018-01-29 LAB — COMPREHENSIVE METABOLIC PANEL
A/G RATIO: 1.4 (ref 1.2–2.2)
ALT: 28 IU/L (ref 0–32)
AST: 18 IU/L (ref 0–40)
Albumin: 4.2 g/dL (ref 3.8–4.9)
Alkaline Phosphatase: 91 IU/L (ref 39–117)
BUN / CREAT RATIO: 20 (ref 9–23)
BUN: 17 mg/dL (ref 6–24)
Bilirubin Total: 0.7 mg/dL (ref 0.0–1.2)
CALCIUM: 9.1 mg/dL (ref 8.7–10.2)
CO2: 22 mmol/L (ref 20–29)
CREATININE: 0.86 mg/dL (ref 0.57–1.00)
Chloride: 104 mmol/L (ref 96–106)
GFR calc Af Amer: 89 mL/min/{1.73_m2} (ref >59)
GFR, EST NON AFRICAN AMERICAN: 77 mL/min/{1.73_m2} (ref >59)
GLOBULIN, TOTAL: 2.9 g/dL (ref 1.5–4.5)
Glucose: 96 mg/dL (ref 65–99)
Potassium: 4.4 mmol/L (ref 3.5–5.2)
SODIUM: 141 mmol/L (ref 134–144)
Total Protein: 7.1 g/dL (ref 6.0–8.5)

## 2018-01-29 LAB — TSH: TSH: 1.82 u[IU]/mL (ref 0.450–4.500)

## 2018-01-30 NOTE — Progress Notes (Signed)
    CHIEF COMPLAINT / HPI: F/u bronchitis. Much better. Still some occasional cough but much better. No fevers. Still has some phlegm---seems tobother her at night--seems to drain from posterior throat, not coming up wit cough.  REVIEW OF SYSTEMS: Pertinent review of systems: negative for fever or unusual weight change.   PERTINENT  PMH / PSH: I have reviewed the patient's medications, allergies, past medical and surgical history, smoking status and updated in the EMR as appropriate.hx breast cancer Last pap smear > 5 yr ago (2013) was negative   OBJECTIVE: Vital signs reviewed. GENERAL: Well-developed, well-nourished, no acute distress. CARDIOVASCULAR: Regular rate and rhythm no murmur gallop or rub LUNGS: Clear to auscultation bilaterally, no rales or wheeze. ABDOMEN: Soft positive bowel sounds NEURO: No gross focal neurological deficits. MSK: Movement of extremity x 4. HEENT: Op with some mild erythema in posterior pharynx and some chronic kinds of changes, no sign infection.TM normal B.     ASSESSMENT / PLAN: 1. Bronchitis---resolved. 2.likely some post nasal drainage causing her phlegm. Will try claritin 3. She needs to reinstate continuity care so recommended she follow up with PCP . Last pap in 2013 so she is due and w hx breast cancer she needs to get back in

## 2018-02-06 ENCOUNTER — Encounter: Payer: Self-pay | Admitting: Family Medicine

## 2018-03-30 ENCOUNTER — Telehealth: Payer: Self-pay | Admitting: Family Medicine

## 2018-03-30 NOTE — Telephone Encounter (Signed)
Interpreter ID [phone]  Left voicemail with patient using [place] interpreter asking if patient would like a telephone encounter rather than coming in person for her appointment tomorrow morning given our effort to reduce exposure to COVID-19.  Left the clinic callback number if patient has any questions or would like to speak with a provider.

## 2018-03-31 ENCOUNTER — Ambulatory Visit: Payer: BLUE CROSS/BLUE SHIELD | Admitting: Family Medicine

## 2018-03-31 ENCOUNTER — Telehealth: Payer: Self-pay | Admitting: Family Medicine

## 2018-03-31 NOTE — Telephone Encounter (Signed)
Spanish interpreter ID [phone]

## 2018-04-01 NOTE — Telephone Encounter (Signed)
Patient unavailable due to busy phone line.

## 2018-08-17 ENCOUNTER — Other Ambulatory Visit: Payer: Self-pay | Admitting: Oncology

## 2018-08-17 DIAGNOSIS — Z1231 Encounter for screening mammogram for malignant neoplasm of breast: Secondary | ICD-10-CM

## 2018-10-01 ENCOUNTER — Ambulatory Visit
Admission: RE | Admit: 2018-10-01 | Discharge: 2018-10-01 | Disposition: A | Discharge status: Home / Self Care | Payer: BC Managed Care – PPO | Source: Ambulatory Visit | Attending: Oncology | Admitting: Oncology

## 2018-10-01 ENCOUNTER — Other Ambulatory Visit: Payer: Self-pay

## 2018-10-01 DIAGNOSIS — Z1231 Encounter for screening mammogram for malignant neoplasm of breast: Secondary | ICD-10-CM | POA: Diagnosis not present

## 2018-10-01 HISTORY — DX: Malignant neoplasm of unspecified site of unspecified female breast: C50.919

## 2019-02-16 DIAGNOSIS — Z20828 Contact with and (suspected) exposure to other viral communicable diseases: Secondary | ICD-10-CM | POA: Diagnosis not present

## 2019-02-16 DIAGNOSIS — J3489 Other specified disorders of nose and nasal sinuses: Secondary | ICD-10-CM | POA: Diagnosis not present

## 2019-05-19 ENCOUNTER — Other Ambulatory Visit: Payer: Self-pay

## 2019-05-19 ENCOUNTER — Emergency Department (HOSPITAL_COMMUNITY): Payer: BC Managed Care – PPO

## 2019-05-19 ENCOUNTER — Encounter (HOSPITAL_COMMUNITY): Payer: Self-pay

## 2019-05-19 ENCOUNTER — Observation Stay (HOSPITAL_COMMUNITY): Payer: BC Managed Care – PPO | Admitting: Anesthesiology

## 2019-05-19 ENCOUNTER — Observation Stay (HOSPITAL_COMMUNITY)
Admission: EM | Admit: 2019-05-19 | Discharge: 2019-05-20 | Disposition: A | Discharge status: Home / Self Care | Payer: BC Managed Care – PPO | Attending: General Surgery | Admitting: General Surgery

## 2019-05-19 ENCOUNTER — Observation Stay (HOSPITAL_COMMUNITY): Payer: BC Managed Care – PPO

## 2019-05-19 ENCOUNTER — Encounter (HOSPITAL_COMMUNITY)
Admission: EM | Disposition: A | Discharge status: Home / Self Care | Payer: Self-pay | Source: Home / Self Care | Attending: Emergency Medicine

## 2019-05-19 DIAGNOSIS — Z923 Personal history of irradiation: Secondary | ICD-10-CM | POA: Insufficient documentation

## 2019-05-19 DIAGNOSIS — K8020 Calculus of gallbladder without cholecystitis without obstruction: Secondary | ICD-10-CM | POA: Diagnosis not present

## 2019-05-19 DIAGNOSIS — D649 Anemia, unspecified: Secondary | ICD-10-CM | POA: Diagnosis not present

## 2019-05-19 DIAGNOSIS — K828 Other specified diseases of gallbladder: Secondary | ICD-10-CM | POA: Insufficient documentation

## 2019-05-19 DIAGNOSIS — Z833 Family history of diabetes mellitus: Secondary | ICD-10-CM | POA: Diagnosis not present

## 2019-05-19 DIAGNOSIS — Z9221 Personal history of antineoplastic chemotherapy: Secondary | ICD-10-CM | POA: Insufficient documentation

## 2019-05-19 DIAGNOSIS — C50511 Malignant neoplasm of lower-outer quadrant of right female breast: Secondary | ICD-10-CM | POA: Diagnosis not present

## 2019-05-19 DIAGNOSIS — K8000 Calculus of gallbladder with acute cholecystitis without obstruction: Secondary | ICD-10-CM | POA: Diagnosis present

## 2019-05-19 DIAGNOSIS — Z91018 Allergy to other foods: Secondary | ICD-10-CM | POA: Diagnosis not present

## 2019-05-19 DIAGNOSIS — Z419 Encounter for procedure for purposes other than remedying health state, unspecified: Secondary | ICD-10-CM

## 2019-05-19 DIAGNOSIS — K8012 Calculus of gallbladder with acute and chronic cholecystitis without obstruction: Secondary | ICD-10-CM | POA: Diagnosis not present

## 2019-05-19 DIAGNOSIS — Z03818 Encounter for observation for suspected exposure to other biological agents ruled out: Secondary | ICD-10-CM | POA: Diagnosis not present

## 2019-05-19 DIAGNOSIS — Z853 Personal history of malignant neoplasm of breast: Secondary | ICD-10-CM | POA: Diagnosis not present

## 2019-05-19 DIAGNOSIS — Z20822 Contact with and (suspected) exposure to covid-19: Secondary | ICD-10-CM | POA: Diagnosis not present

## 2019-05-19 DIAGNOSIS — K810 Acute cholecystitis: Secondary | ICD-10-CM | POA: Diagnosis not present

## 2019-05-19 DIAGNOSIS — R1011 Right upper quadrant pain: Secondary | ICD-10-CM | POA: Diagnosis not present

## 2019-05-19 DIAGNOSIS — R569 Unspecified convulsions: Secondary | ICD-10-CM | POA: Diagnosis not present

## 2019-05-19 DIAGNOSIS — R079 Chest pain, unspecified: Secondary | ICD-10-CM | POA: Diagnosis not present

## 2019-05-19 DIAGNOSIS — K8066 Calculus of gallbladder and bile duct with acute and chronic cholecystitis without obstruction: Secondary | ICD-10-CM | POA: Diagnosis not present

## 2019-05-19 HISTORY — PX: CHOLECYSTECTOMY: SHX55

## 2019-05-19 LAB — URINALYSIS, ROUTINE W REFLEX MICROSCOPIC
Bacteria, UA: NONE SEEN
Bilirubin Urine: NEGATIVE
Glucose, UA: NEGATIVE mg/dL
Ketones, ur: 5 mg/dL — AB
Leukocytes,Ua: NEGATIVE
Nitrite: NEGATIVE
Protein, ur: NEGATIVE mg/dL
Specific Gravity, Urine: 1.005 (ref 1.005–1.030)
pH: 7 (ref 5.0–8.0)

## 2019-05-19 LAB — LIPASE, BLOOD: Lipase: 17 U/L (ref 11–51)

## 2019-05-19 LAB — COMPREHENSIVE METABOLIC PANEL
ALT: 23 U/L (ref 0–44)
AST: 20 U/L (ref 15–41)
Albumin: 4.2 g/dL (ref 3.5–5.0)
Alkaline Phosphatase: 70 U/L (ref 38–126)
Anion gap: 14 (ref 5–15)
BUN: 13 mg/dL (ref 6–20)
CO2: 22 mmol/L (ref 22–32)
Calcium: 9.5 mg/dL (ref 8.9–10.3)
Chloride: 100 mmol/L (ref 98–111)
Creatinine, Ser: 0.89 mg/dL (ref 0.44–1.00)
GFR calc Af Amer: >60 mL/min (ref >60)
GFR calc non Af Amer: >60 mL/min (ref >60)
Glucose, Bld: 147 mg/dL — ABNORMAL HIGH (ref 70–99)
Potassium: 4.1 mmol/L (ref 3.5–5.1)
Sodium: 136 mmol/L (ref 135–145)
Total Bilirubin: 2.4 mg/dL — ABNORMAL HIGH (ref 0.3–1.2)
Total Protein: 7.8 g/dL (ref 6.5–8.1)

## 2019-05-19 LAB — CBC WITH DIFFERENTIAL/PLATELET
Abs Immature Granulocytes: 0.98 10*3/uL — ABNORMAL HIGH (ref 0.00–0.07)
Basophils Absolute: 0 10*3/uL (ref 0.0–0.1)
Basophils Relative: 1 %
Eosinophils Absolute: 0.3 10*3/uL (ref 0.0–0.5)
Eosinophils Relative: 3 %
HCT: 32.5 % — ABNORMAL LOW (ref 36.0–46.0)
Hemoglobin: 10.7 g/dL — ABNORMAL LOW (ref 12.0–15.0)
Immature Granulocytes: 11 %
Lymphocytes Relative: 16 %
Lymphs Abs: 1.4 10*3/uL (ref 0.7–4.0)
MCH: 30.8 pg (ref 26.0–34.0)
MCHC: 32.9 g/dL (ref 30.0–36.0)
MCV: 93.7 fL (ref 80.0–100.0)
Monocytes Absolute: 0.3 10*3/uL (ref 0.1–1.0)
Monocytes Relative: 4 %
Neutro Abs: 5.7 10*3/uL (ref 1.7–7.7)
Neutrophils Relative %: 65 %
Platelets: 198 10*3/uL (ref 150–400)
RBC: 3.47 MIL/uL — ABNORMAL LOW (ref 3.87–5.11)
RDW: 17.3 % — ABNORMAL HIGH (ref 11.5–15.5)
WBC Morphology: INCREASED
WBC: 8.7 10*3/uL (ref 4.0–10.5)
nRBC: 0.9 % — ABNORMAL HIGH (ref 0.0–0.2)

## 2019-05-19 LAB — POC URINE PREG, ED: Preg Test, Ur: NEGATIVE

## 2019-05-19 LAB — SARS CORONAVIRUS 2 BY RT PCR (HOSPITAL ORDER, PERFORMED IN ~~LOC~~ HOSPITAL LAB): SARS Coronavirus 2: NEGATIVE

## 2019-05-19 SURGERY — LAPAROSCOPIC CHOLECYSTECTOMY WITH INTRAOPERATIVE CHOLANGIOGRAM
Anesthesia: General | Site: Abdomen

## 2019-05-19 MED ORDER — OXYCODONE HCL 5 MG PO TABS
5.0000 mg | ORAL_TABLET | ORAL | Status: DC | PRN
  Administered 2019-05-19: 10 mg via ORAL
  Filled 2019-05-19 (×2): qty 2

## 2019-05-19 MED ORDER — PANTOPRAZOLE SODIUM 40 MG IV SOLR
40.0000 mg | Freq: Every day | INTRAVENOUS | Status: DC
  Administered 2019-05-19: 40 mg via INTRAVENOUS
  Filled 2019-05-19: qty 40

## 2019-05-19 MED ORDER — MORPHINE SULFATE (PF) 4 MG/ML IV SOLN
4.0000 mg | Freq: Once | INTRAVENOUS | Status: AC
  Administered 2019-05-19: 11:00:00 4 mg via INTRAVENOUS
  Filled 2019-05-19: qty 1

## 2019-05-19 MED ORDER — LACTATED RINGERS IV SOLN: INTRAVENOUS | Status: DC

## 2019-05-19 MED ORDER — MIDAZOLAM HCL 2 MG/2ML IJ SOLN
INTRAMUSCULAR | Status: DC | PRN
  Administered 2019-05-19: 2 mg via INTRAVENOUS

## 2019-05-19 MED ORDER — ACETAMINOPHEN 500 MG PO TABS
1000.0000 mg | ORAL_TABLET | ORAL | Status: AC
  Administered 2019-05-19: 1000 mg via ORAL
  Filled 2019-05-19: qty 2

## 2019-05-19 MED ORDER — OXYCODONE HCL 5 MG PO TABS: 5.0000 mg | ORAL_TABLET | Freq: Once | ORAL | Status: DC | PRN

## 2019-05-19 MED ORDER — TRAMADOL HCL 50 MG PO TABS
50.0000 mg | ORAL_TABLET | Freq: Four times a day (QID) | ORAL | Status: DC | PRN
  Filled 2019-05-19: qty 1

## 2019-05-19 MED ORDER — ONDANSETRON HCL 4 MG/2ML IJ SOLN
4.0000 mg | Freq: Four times a day (QID) | INTRAMUSCULAR | Status: DC | PRN
  Filled 2019-05-19: qty 2

## 2019-05-19 MED ORDER — DIPHENHYDRAMINE HCL 25 MG PO CAPS: 25.0000 mg | ORAL_CAPSULE | Freq: Four times a day (QID) | ORAL | Status: DC | PRN

## 2019-05-19 MED ORDER — KCL IN DEXTROSE-NACL 20-5-0.45 MEQ/L-%-% IV SOLN
INTRAVENOUS | Status: DC
  Filled 2019-05-19 (×3): qty 1000

## 2019-05-19 MED ORDER — BUPIVACAINE HCL 0.25 % IJ SOLN
INTRAMUSCULAR | Status: DC | PRN
  Administered 2019-05-19: 30 mL

## 2019-05-19 MED ORDER — DOCUSATE SODIUM 100 MG PO CAPS
100.0000 mg | ORAL_CAPSULE | Freq: Two times a day (BID) | ORAL | Status: DC
  Administered 2019-05-19 – 2019-05-20 (×2): 100 mg via ORAL
  Filled 2019-05-19 (×2): qty 1

## 2019-05-19 MED ORDER — PROPOFOL 10 MG/ML IV BOLUS
INTRAVENOUS | Status: AC
  Filled 2019-05-19: qty 20

## 2019-05-19 MED ORDER — ONDANSETRON HCL 4 MG/2ML IJ SOLN
4.0000 mg | Freq: Four times a day (QID) | INTRAMUSCULAR | Status: DC | PRN
  Administered 2019-05-20: 4 mg via INTRAVENOUS

## 2019-05-19 MED ORDER — ONDANSETRON 4 MG PO TBDP: 4.0000 mg | ORAL_TABLET | Freq: Four times a day (QID) | ORAL | Status: DC | PRN

## 2019-05-19 MED ORDER — METOPROLOL TARTRATE 5 MG/5ML IV SOLN: 5.0000 mg | Freq: Four times a day (QID) | INTRAVENOUS | Status: DC | PRN

## 2019-05-19 MED ORDER — 0.9 % SODIUM CHLORIDE (POUR BTL) OPTIME
TOPICAL | Status: DC | PRN
  Administered 2019-05-19: 1000 mL

## 2019-05-19 MED ORDER — ACETAMINOPHEN 500 MG PO TABS
1000.0000 mg | ORAL_TABLET | Freq: Four times a day (QID) | ORAL | Status: DC
  Administered 2019-05-19 – 2019-05-20 (×3): 1000 mg via ORAL
  Filled 2019-05-19 (×3): qty 2

## 2019-05-19 MED ORDER — SODIUM CHLORIDE 0.9 % IR SOLN
Status: DC | PRN
  Administered 2019-05-19: 1000 mL

## 2019-05-19 MED ORDER — SODIUM CHLORIDE 0.9 % IV SOLN
INTRAVENOUS | Status: DC | PRN
  Administered 2019-05-19: 7 mL

## 2019-05-19 MED ORDER — ACETAMINOPHEN 325 MG PO TABS: 650.0000 mg | ORAL_TABLET | Freq: Four times a day (QID) | ORAL | Status: DC | PRN

## 2019-05-19 MED ORDER — PROPOFOL 10 MG/ML IV BOLUS
INTRAVENOUS | Status: DC | PRN
  Administered 2019-05-19: 120 mg via INTRAVENOUS

## 2019-05-19 MED ORDER — SODIUM CHLORIDE 0.9 % IV SOLN
2.0000 g | Freq: Once | INTRAVENOUS | Status: AC
  Administered 2019-05-19: 2 g via INTRAVENOUS
  Filled 2019-05-19: qty 20

## 2019-05-19 MED ORDER — SUGAMMADEX SODIUM 200 MG/2ML IV SOLN
INTRAVENOUS | Status: DC | PRN
  Administered 2019-05-19: 200 mg via INTRAVENOUS

## 2019-05-19 MED ORDER — POLYETHYLENE GLYCOL 3350 17 G PO PACK: 17.0000 g | PACK | Freq: Every day | ORAL | Status: DC | PRN

## 2019-05-19 MED ORDER — BUPIVACAINE HCL (PF) 0.25 % IJ SOLN
INTRAMUSCULAR | Status: AC
  Filled 2019-05-19: qty 30

## 2019-05-19 MED ORDER — GABAPENTIN 300 MG PO CAPS
300.0000 mg | ORAL_CAPSULE | Freq: Two times a day (BID) | ORAL | Status: DC
  Administered 2019-05-19 – 2019-05-20 (×2): 300 mg via ORAL
  Filled 2019-05-19 (×2): qty 1

## 2019-05-19 MED ORDER — ROCURONIUM BROMIDE 10 MG/ML (PF) SYRINGE
PREFILLED_SYRINGE | INTRAVENOUS | Status: DC | PRN
  Administered 2019-05-19: 50 mg via INTRAVENOUS

## 2019-05-19 MED ORDER — ACETAMINOPHEN 650 MG RE SUPP: 650.0000 mg | Freq: Four times a day (QID) | RECTAL | Status: DC | PRN

## 2019-05-19 MED ORDER — DIPHENHYDRAMINE HCL 50 MG/ML IJ SOLN: 25.0000 mg | Freq: Four times a day (QID) | INTRAMUSCULAR | Status: DC | PRN

## 2019-05-19 MED ORDER — CELECOXIB 200 MG PO CAPS
200.0000 mg | ORAL_CAPSULE | Freq: Two times a day (BID) | ORAL | Status: DC
  Administered 2019-05-19: 200 mg via ORAL
  Filled 2019-05-19 (×2): qty 1

## 2019-05-19 MED ORDER — MORPHINE SULFATE (PF) 2 MG/ML IV SOLN: 2.0000 mg | INTRAVENOUS | Status: DC | PRN

## 2019-05-19 MED ORDER — GABAPENTIN 300 MG PO CAPS
300.0000 mg | ORAL_CAPSULE | ORAL | Status: AC
  Administered 2019-05-19: 300 mg via ORAL
  Filled 2019-05-19: qty 1

## 2019-05-19 MED ORDER — POTASSIUM CHLORIDE 2 MEQ/ML IV SOLN: INTRAVENOUS | Status: DC

## 2019-05-19 MED ORDER — BISACODYL 10 MG RE SUPP: 10.0000 mg | Freq: Every day | RECTAL | Status: DC | PRN

## 2019-05-19 MED ORDER — ONDANSETRON HCL 4 MG/2ML IJ SOLN
4.0000 mg | INTRAMUSCULAR | Status: DC | PRN
  Administered 2019-05-19: 17:00:00 4 mg via INTRAVENOUS

## 2019-05-19 MED ORDER — ENOXAPARIN SODIUM 40 MG/0.4ML ~~LOC~~ SOLN: 40.0000 mg | SUBCUTANEOUS | Status: DC

## 2019-05-19 MED ORDER — MIDAZOLAM HCL 2 MG/2ML IJ SOLN
INTRAMUSCULAR | Status: AC
  Filled 2019-05-19: qty 2

## 2019-05-19 MED ORDER — SUCCINYLCHOLINE CHLORIDE 200 MG/10ML IV SOSY
PREFILLED_SYRINGE | INTRAVENOUS | Status: AC
  Filled 2019-05-19: qty 10

## 2019-05-19 MED ORDER — SODIUM CHLORIDE 0.9 % IV BOLUS
1000.0000 mL | Freq: Once | INTRAVENOUS | Status: AC
  Administered 2019-05-19: 07:00:00 1000 mL via INTRAVENOUS

## 2019-05-19 MED ORDER — LIDOCAINE 2% (20 MG/ML) 5 ML SYRINGE
INTRAMUSCULAR | Status: DC | PRN
  Administered 2019-05-19: 40 mg via INTRAVENOUS

## 2019-05-19 MED ORDER — LIDOCAINE 2% (20 MG/ML) 5 ML SYRINGE
INTRAMUSCULAR | Status: AC
  Filled 2019-05-19: qty 5

## 2019-05-19 MED ORDER — FENTANYL CITRATE (PF) 250 MCG/5ML IJ SOLN
INTRAMUSCULAR | Status: DC | PRN
  Administered 2019-05-19 (×2): 50 ug via INTRAVENOUS
  Administered 2019-05-19: 100 ug via INTRAVENOUS
  Administered 2019-05-19: 50 ug via INTRAVENOUS

## 2019-05-19 MED ORDER — KETOROLAC TROMETHAMINE 30 MG/ML IJ SOLN: 30.0000 mg | Freq: Four times a day (QID) | INTRAMUSCULAR | Status: DC | PRN

## 2019-05-19 MED ORDER — DEXAMETHASONE SODIUM PHOSPHATE 10 MG/ML IJ SOLN
INTRAMUSCULAR | Status: DC | PRN
  Administered 2019-05-19: 5 mg via INTRAVENOUS

## 2019-05-19 MED ORDER — ENOXAPARIN SODIUM 40 MG/0.4ML ~~LOC~~ SOLN
40.0000 mg | SUBCUTANEOUS | Status: DC
  Administered 2019-05-20: 40 mg via SUBCUTANEOUS
  Filled 2019-05-19: qty 0.4

## 2019-05-19 MED ORDER — METHOCARBAMOL 500 MG PO TABS: 500.0000 mg | ORAL_TABLET | Freq: Four times a day (QID) | ORAL | Status: DC | PRN

## 2019-05-19 MED ORDER — MORPHINE SULFATE (PF) 4 MG/ML IV SOLN
4.0000 mg | Freq: Once | INTRAVENOUS | Status: AC
  Administered 2019-05-19: 07:00:00 4 mg via INTRAVENOUS
  Filled 2019-05-19: qty 1

## 2019-05-19 MED ORDER — PROMETHAZINE HCL 25 MG/ML IJ SOLN: 6.2500 mg | INTRAMUSCULAR | Status: DC | PRN

## 2019-05-19 MED ORDER — ONDANSETRON HCL 4 MG/2ML IJ SOLN
4.0000 mg | Freq: Once | INTRAMUSCULAR | Status: AC
  Administered 2019-05-19: 07:00:00 4 mg via INTRAVENOUS
  Filled 2019-05-19: qty 2

## 2019-05-19 MED ORDER — SIMETHICONE 80 MG PO CHEW: 40.0000 mg | CHEWABLE_TABLET | Freq: Four times a day (QID) | ORAL | Status: DC | PRN

## 2019-05-19 MED ORDER — OXYCODONE HCL 5 MG/5ML PO SOLN: 5.0000 mg | Freq: Once | ORAL | Status: DC | PRN

## 2019-05-19 MED ORDER — MORPHINE SULFATE (PF) 2 MG/ML IV SOLN: 1.0000 mg | INTRAVENOUS | Status: DC | PRN

## 2019-05-19 MED ORDER — FENTANYL CITRATE (PF) 250 MCG/5ML IJ SOLN
INTRAMUSCULAR | Status: AC
  Filled 2019-05-19: qty 5

## 2019-05-19 MED ORDER — OXYCODONE HCL 5 MG PO TABS
5.0000 mg | ORAL_TABLET | ORAL | Status: DC | PRN
  Administered 2019-05-20: 10 mg via ORAL
  Filled 2019-05-19: qty 2

## 2019-05-19 MED ORDER — FENTANYL CITRATE (PF) 100 MCG/2ML IJ SOLN: 25.0000 ug | INTRAMUSCULAR | Status: DC | PRN

## 2019-05-19 SURGICAL SUPPLY — 43 items
APPLIER CLIP ROT 10 11.4 M/L (STAPLE) ×3
BLADE CLIPPER SURG (BLADE) IMPLANT
CANISTER SUCT 3000ML PPV (MISCELLANEOUS) ×3 IMPLANT
CATH CHOLANG 76X19 KUMAR (CATHETERS) ×3 IMPLANT
CHLORAPREP W/TINT 26 (MISCELLANEOUS) ×3 IMPLANT
CLIP APPLIE ROT 10 11.4 M/L (STAPLE) ×1 IMPLANT
CLIP VESOLOCK MED LG 6/CT (CLIP) IMPLANT
COVER MAYO STAND STRL (DRAPES) ×3 IMPLANT
COVER SURGICAL LIGHT HANDLE (MISCELLANEOUS) ×3 IMPLANT
COVER WAND RF STERILE (DRAPES) ×3 IMPLANT
DERMABOND ADVANCED (GAUZE/BANDAGES/DRESSINGS) ×2
DERMABOND ADVANCED .7 DNX12 (GAUZE/BANDAGES/DRESSINGS) ×1 IMPLANT
DRAPE C-ARM 42X120 X-RAY (DRAPES) ×3 IMPLANT
ELECT REM PT RETURN 9FT ADLT (ELECTROSURGICAL) ×3
ELECTRODE REM PT RTRN 9FT ADLT (ELECTROSURGICAL) ×1 IMPLANT
GLOVE BIOGEL PI IND STRL 7.0 (GLOVE) ×1 IMPLANT
GLOVE BIOGEL PI INDICATOR 7.0 (GLOVE) ×2
GLOVE SURG SS PI 7.0 STRL IVOR (GLOVE) ×3 IMPLANT
GOWN STRL REUS W/ TWL LRG LVL3 (GOWN DISPOSABLE) ×3 IMPLANT
GOWN STRL REUS W/TWL LRG LVL3 (GOWN DISPOSABLE) ×9
GRASPER SUT TROCAR 14GX15 (MISCELLANEOUS) ×3 IMPLANT
KIT BASIN OR (CUSTOM PROCEDURE TRAY) ×3 IMPLANT
KIT TURNOVER KIT B (KITS) ×3 IMPLANT
NEEDLE 22X1 1/2 (OR ONLY) (NEEDLE) ×3 IMPLANT
NS IRRIG 1000ML POUR BTL (IV SOLUTION) ×3 IMPLANT
PAD ARMBOARD 7.5X6 YLW CONV (MISCELLANEOUS) ×3 IMPLANT
POUCH RETRIEVAL ECOSAC 10 (ENDOMECHANICALS) ×1 IMPLANT
POUCH RETRIEVAL ECOSAC 10MM (ENDOMECHANICALS) ×3
SCISSORS LAP 5X35 DISP (ENDOMECHANICALS) ×3 IMPLANT
SET CHOLANGIOGRAPH 5 50 .035 (SET/KITS/TRAYS/PACK) ×3 IMPLANT
SET IRRIG TUBING LAPAROSCOPIC (IRRIGATION / IRRIGATOR) ×3 IMPLANT
SET TUBE SMOKE EVAC HIGH FLOW (TUBING) ×3 IMPLANT
SLEEVE ENDOPATH XCEL 5M (ENDOMECHANICALS) ×6 IMPLANT
SPECIMEN JAR SMALL (MISCELLANEOUS) ×3 IMPLANT
STOPCOCK 4 WAY LG BORE MALE ST (IV SETS) ×3 IMPLANT
SUT MNCRL AB 4-0 PS2 18 (SUTURE) ×3 IMPLANT
SUT VICRYL 0 UR6 27IN ABS (SUTURE) ×6 IMPLANT
TOWEL GREEN STERILE (TOWEL DISPOSABLE) ×3 IMPLANT
TOWEL GREEN STERILE FF (TOWEL DISPOSABLE) ×3 IMPLANT
TRAY LAPAROSCOPIC MC (CUSTOM PROCEDURE TRAY) ×3 IMPLANT
TROCAR XCEL 12X100 BLDLESS (ENDOMECHANICALS) ×3 IMPLANT
TROCAR XCEL NON-BLD 5MMX100MML (ENDOMECHANICALS) ×3 IMPLANT
WATER STERILE IRR 1000ML POUR (IV SOLUTION) ×3 IMPLANT

## 2019-05-19 NOTE — Op Note (Signed)
PATIENT:  87  54 y.o. female  PRE-OPERATIVE DIAGNOSIS:  Acut cholecystitis  POST-OPERATIVE DIAGNOSIS:  Acut cholecystitis  PROCEDURE:  Procedure(s): LAPAROSCOPIC CHOLECYSTECTOMY WITH INTRAOPERATIVE CHOLANGIOGRAM   SURGEON:  Surgeon(s): Teyonna Plaisted, Arta Bruce, MD  ASSISTANT: Alferd Apa, M.D.  ANESTHESIA:   local and general  Indications for procedure: Karen Bullock is a 54 y.o. female with symptoms of Abdominal pain and Nausea and vomiting consistent with gallbladder disease, Confirmed by Ultrasound.  Description of procedure: The patient was brought into the operative suite, placed supine. Anesthesia was administered with endotracheal tube. Patient was strapped in place and foot board was secured. All pressure points were offloaded by foam padding. The patient was prepped and draped in the usual sterile fashion.  A small incision was made to the right of the umbilicus. A 24mm trocar was inserted into the peritoneal cavity with optical entry. Pneumoperitoneum was applied with high flow low pressure. 2 64mm trocars were placed in the RUQ. A 39mm trocar was placed in the subxiphoid space. Marcaine was infused to the subxiphoid space and lateral upper right abdomen in the transversus abdominis plane. Next the patient was placed in reverse trendelenberg. The gallbladder was very tense and distended with signs of acute inflammation and the fundus had a small area of necrosis. Suction device was used to decompress the gallbladder. There were adhesions along the lateral liver that were taken down with cautery.  The gallbladder was retracted cephalad and lateral. The peritoneum was reflected off the infundibulum working lateral to medial. The cystic duct and cystic artery were identified and further dissection revealed a critical view, due to concern for choledocholithiasis a cholangiogram was performed with ductotomy and cook catheter passed through a separate subcostal stab incision. Normal  ductal anatomy was seen. The cystic duct and cystic artery were doubly clipped and ligated.   The gallbladder was removed off the liver bed with cautery. The Gallbladder was placed in a specimen bag. The gallbladder fossa was irrigated and hemostasis was applied with cautery. The gallbladder was removed via the 6mm trocar. The fascial defect was closed with interrupted 0 vicryl suture via laparoscopic trans-fascial suture passer. Pneumoperitoneum was removed, all trocar were removed. All incisions were closed with 4-0 monocryl subcuticular stitch. The patient woke from anesthesia and was brought to PACU in stable condition. All counts were correct  Findings: acute cholecystitis  Specimen: gallbladder  Blood loss: 50 ml  Local anesthesia: 30 ml marcaine  Complications: none  PLAN OF CARE: Admit for overnight observation  PATIENT DISPOSITION:  PACU - hemodynamically stable.  Images:     Gurney Maxin, M.D. General, Bariatric, & Minimally Invasive Surgery [facility]

## 2019-05-19 NOTE — ED Triage Notes (Signed)
Pt presents from home c/o 2 day epigastric pain worse in RUQ. Endorses SOB periodically, emesis beginning at 0200 with some blood. Pain 9/10.

## 2019-05-19 NOTE — Anesthesia Preprocedure Evaluation (Addendum)
Anesthesia Evaluation  Patient identified by MRN, date of birth, ID band Patient awake    Reviewed: Allergy & Precautions, NPO status , Patient's Chart, lab work & pertinent test results  History of Anesthesia Complications Negative for: history of anesthetic complications  Airway Mallampati: II  TM Distance: >3 FB Neck ROM: Full    Dental  (+) Teeth Intact, Dental Advisory Given   Pulmonary neg pulmonary ROS,    breath sounds clear to auscultation       Cardiovascular negative cardio ROS   Rhythm:Regular Rate:Normal     Neuro/Psych negative neurological ROS  negative psych ROS   GI/Hepatic Neg liver ROS,  Cholecystitis    Endo/Other  negative endocrine ROS  Renal/GU negative Renal ROS     Musculoskeletal negative musculoskeletal ROS (+)   Abdominal Normal abdominal exam  (+)   Peds  Hematology  (+) anemia ,   Anesthesia Other Findings Covid neg 5/12   Reproductive/Obstetrics  Breast cancer                             Anesthesia Physical Anesthesia Plan  ASA: II  Anesthesia Plan: General   Post-op Pain Management:    Induction: Intravenous  PONV Risk Score and Plan: 4 or greater and Treatment may vary due to age or medical condition, Ondansetron, Scopolamine patch - Pre-op, Midazolam and Dexamethasone  Airway Management Planned: Oral ETT  Additional Equipment: None  Intra-op Plan:   Post-operative Plan: Extubation in OR  Informed Consent: I have reviewed the patients History and Physical, chart, labs and discussed the procedure including the risks, benefits and alternatives for the proposed anesthesia with the patient or authorized representative who has indicated his/her understanding and acceptance.     Dental advisory given  Plan Discussed with: CRNA and Anesthesiologist  Anesthesia Plan Comments:        Anesthesia Quick Evaluation

## 2019-05-19 NOTE — Transfer of Care (Signed)
Immediate Anesthesia Transfer of Care Note  Patient: Karen Bullock  Procedure(s) Performed: LAPAROSCOPIC CHOLECYSTECTOMY WITH INTRAOPERATIVE CHOLANGIOGRAM (N/A Abdomen)  Patient Location: PACU  Anesthesia Type:General  Level of Consciousness: drowsy and responds to stimulation  Airway & Oxygen Therapy: Patient Spontanous Breathing and Patient connected to nasal cannula oxygen  Post-op Assessment: Report given to RN and Post -op Vital signs reviewed and stable  Post vital signs: Reviewed and stable  Last Vitals:  Vitals Value Taken Time  BP 124/72 05/19/19 1702  Temp    Pulse 97 05/19/19 1703  Resp 18 05/19/19 1703  SpO2 97 % 05/19/19 1703  Vitals shown include unvalidated device data.  Last Pain:  Vitals:   05/19/19 1148  TempSrc:   PainSc: Asleep         Complications: No apparent anesthesia complications

## 2019-05-19 NOTE — H&P (Signed)
Karen Bullock Nov 16, 1965  EY:[id number].    Chief Complaint/Reason for Consult: acute cholecystitis  HPI:  This is a pleasant 54 yo Hispanic female with a past history of breast cancer who began having post prandial epigastric pain radiating to her back about a week ago.  It improved the following day, but began again the next day.  She then developed significant nausea and vomiting.  Denies diarrhea.  Had some subjective fevers and chills with vomiting.  She denies any other complaints.  Due to worsening pain, she presented to the ED today.  She has a normal WBC and LFTs except a TB of 2.4.  She has an abdomina US that reveals a gallbladder wall at 13.43mm thick and cholelithiasis.  We have been asked to see her for surgical management.  ROS: ROS: Please see HPI, otherwise all other systems have been reviewed and are negative.  Family History  Problem Relation Age of Onset  . Diabetes Father   . Cancer Neg Hx   . Hypertension Neg Hx   . Heart disease Neg Hx   . Breast cancer Neg Hx     Past Medical History:  Diagnosis Date  . Breast cancer ([place])    Right Breast Cancer  . Breast cancer, right breast ([place]) 01/15/2008  . Cancer ([place])   . Dislocation of left shoulder joint   . Personal history of chemotherapy 2010   Right Breast Cancer  . Personal history of radiation therapy 2010   Right Breast Cancer    Past Surgical History:  Procedure Laterality Date  . BREAST LUMPECTOMY Right 2010  . BREAST SURGERY  2010   lumpectomy    Social History:  reports that she has never smoked. She has never used smokeless tobacco. She reports current drug use. She reports that she does not drink alcohol.  Allergies:  Allergies  Allergen Reactions  . Pork-Derived Products     Rash, itching    (Not in a hospital admission)    Physical Exam: Blood pressure 131/80, pulse 75, temperature 98.7 F (37.1 C), temperature source Oral, resp. rate 19, height 5\' 3"  (1.6 m), weight 68 kg, last  menstrual period 07/08/2011, SpO2 93 %. General: pleasant, WD, WN Hhispanic female who is laying in bed in NAD HEENT: head is normocephalic, atraumatic.  Sclera are noninjected.  PERRL.  Ears and nose without any masses or lesions.  Mouth is pink and moist Heart: regular, rate, and rhythm.  Normal s1,s2. No obvious murmurs, gallops, or rubs noted.  Palpable radial and pedal pulses bilaterally Lungs: CTAB, no wheezes, rhonchi, or rales noted.  Respiratory effort nonlabored Abd: soft, tender in RUQ, no Murphy's sign currently but has received medication recently, ND, +BS, no masses, hernias, or organomegaly MS: all 4 extremities are symmetrical with no cyanosis, clubbing, or edema. Skin: warm and dry with no masses, lesions, or rashes Neuro: Cranial nerves 2-12 grossly intact, sensation is normal throughout Psych: A&Ox3 with an appropriate affect.   Results for orders placed or performed during the hospital encounter of 05/19/19 (from the past 48 hour(s))  CBC with Differential     Status: Abnormal   Collection Time: 05/19/19  6:27 AM  Result Value Ref Range   WBC 8.7 4.0 - 10.5 K/uL    Comment: WHITE COUNT CONFIRMED ON SMEAR   RBC 3.47 (L) 3.87 - 5.11 MIL/uL   Hemoglobin 10.7 (L) 12.0 - 15.0 g/dL   HCT 32.5 (L) 36.0 - 46.0 %  MCV 93.7 80.0 - 100.0 fL   MCH 30.8 26.0 - 34.0 pg   MCHC 32.9 30.0 - 36.0 g/dL   RDW 17.3 (H) 11.5 - 15.5 %   Platelets 198 150 - 400 K/uL   nRBC 0.9 (H) 0.0 - 0.2 %   Neutrophils Relative % 65 %   Neutro Abs 5.7 1.7 - 7.7 K/uL   Lymphocytes Relative 16 %   Lymphs Abs 1.4 0.7 - 4.0 K/uL   Monocytes Relative 4 %   Monocytes Absolute 0.3 0.1 - 1.0 K/uL   Eosinophils Relative 3 %   Eosinophils Absolute 0.3 0.0 - 0.5 K/uL   Basophils Relative 1 %   Basophils Absolute 0.0 0.0 - 0.1 K/uL   WBC Morphology INCREASED BANDS (>20% BANDS)     Comment: MILD LEFT SHIFT (1-5% METAS, OCC MYELO, OCC BANDS)   Immature Granulocytes 11 %   Abs Immature Granulocytes 0.98 (H)  0.00 - 0.07 K/uL    Comment: Performed at [facility] [address] [place], [place] [address]  Comprehensive metabolic panel     Status: Abnormal   Collection Time: 05/19/19  6:27 AM  Result Value Ref Range   Sodium 136 135 - 145 mmol/L   Potassium 4.1 3.5 - 5.1 mmol/L   Chloride 100 98 - 111 mmol/L   CO2 22 22 - 32 mmol/L   Glucose, Bld 147 (H) 70 - 99 mg/dL    Comment: Glucose reference range applies only to samples taken after fasting for at least 8 hours.   BUN 13 6 - 20 mg/dL   Creatinine, Ser 0.89 0.44 - 1.00 mg/dL   Calcium 9.5 8.9 - 10.3 mg/dL   Total Protein 7.8 6.5 - 8.1 g/dL   Albumin 4.2 3.5 - 5.0 g/dL   AST 20 15 - 41 U/L   ALT 23 0 - 44 U/L   Alkaline Phosphatase 70 38 - 126 U/L   Total Bilirubin 2.4 (H) 0.3 - 1.2 mg/dL   GFR calc non Af Amer >60 >60 mL/min   GFR calc Af Amer >60 >60 mL/min   Anion gap 14 5 - 15    Comment: Performed at [facility], [place] [address], [place], [place] [address]  Lipase, blood     Status: None   Collection Time: 05/19/19  6:27 AM  Result Value Ref Range   Lipase 17 11 - 51 U/L    Comment: Performed at [facility] [address], [place], [place] [address]  Urinalysis, Routine w reflex microscopic     Status: Abnormal   Collection Time: 05/19/19  8:13 AM  Result Value Ref Range   Color, Urine STRAW (A) YELLOW   APPearance CLEAR CLEAR   Specific Gravity, Urine 1.005 1.005 - 1.030   pH 7.0 5.0 - 8.0   Glucose, UA NEGATIVE NEGATIVE mg/dL   Hgb urine dipstick SMALL (A) NEGATIVE   Bilirubin Urine NEGATIVE NEGATIVE   Ketones, ur 5 (A) NEGATIVE mg/dL   Protein, ur NEGATIVE NEGATIVE mg/dL   Nitrite NEGATIVE NEGATIVE   Leukocytes,Ua NEGATIVE NEGATIVE   RBC / HPF 0-5 0 - 5 RBC/hpf   WBC, UA 0-5 0 - 5 WBC/hpf   Bacteria, UA NONE SEEN NONE SEEN   Squamous Epithelial / LPF 0-5 0 - 5    Comment: Performed at [facility] [address] [place],  [address]  POC Urine Pregnancy, ED (not at [facility])      Status:  None   Collection Time: 05/19/19  8:16 AM  Result Value Ref Range   Preg Test, Ur NEGATIVE NEGATIVE    Comment:        THE SENSITIVITY OF THIS METHODOLOGY IS >24 mIU/mL   SARS Coronavirus 2 by RT PCR (hospital order, performed in [facility] hospital lab) Nasopharyngeal Nasopharyngeal Swab     Status: None   Collection Time: 05/19/19  9:03 AM   Specimen: Nasopharyngeal Swab  Result Value Ref Range   SARS Coronavirus 2 NEGATIVE NEGATIVE    Comment: (NOTE) SARS-CoV-2 target nucleic acids are NOT DETECTED. The SARS-CoV-2 RNA is generally detectable in upper and lower respiratory specimens during the acute phase of infection. The lowest concentration of SARS-CoV-2 viral copies this assay can detect is 250 copies / mL. A negative result does not preclude SARS-CoV-2 infection and should not be used as the sole basis for treatment or other patient management decisions.  A negative result may occur with improper specimen collection / handling, submission of specimen other than nasopharyngeal swab, presence of viral mutation(s) within the areas targeted by this assay, and inadequate number of viral copies (<250 copies / mL). A negative result must be combined with clinical observations, patient history, and epidemiological information. Fact Sheet for Patients:   StrictlyIdeas.no Fact Sheet for Healthcare Providers: BankingDealers.co.za This test is not yet approved or cleared  by the [place] FDA and has been authorized for detection and/or diagnosis of SARS-CoV-2 by FDA under an Emergency Use Authorization (EUA).  This EUA will remain in effect (meaning this test can be used) for the duration of the COVID-19 declaration under Section 564(b)(1) of the Act, 21 U.S.C. section 360bbb-3(b)(1), unless the authorization is terminated or revoked sooner. Performed at [facility], [place] [address] [place], [place] [address]    US  Abdomen Limited RUQ  Result Date: 05/19/2019 CLINICAL DATA:  Right upper quadrant pain EXAM: ULTRASOUND ABDOMEN LIMITED RIGHT UPPER QUADRANT COMPARISON:  None. FINDINGS: Gallbladder: Cholelithiasis with the largest gallstone measuring 7 mm. Severely thickened gallbladder wall measuring up to 13.6 mm along the fundus. Negative sonographic Murphy sign. Common bile duct: Diameter: 4.4 mm Liver: No focal lesion identified. Within normal limits in parenchymal echogenicity. Portal vein is patent on color Doppler imaging with normal direction of blood flow towards the liver. Other: None. IMPRESSION: Cholelithiasis with severe gallbladder wall thickening concerning for cholecystitis. Negative sonographic Murphy sign. If there is further clinical concern, recommend evaluation with a HIDA scan. Electronically Signed   By: Kathreen Devoid   On: 05/19/2019 08:30      Assessment/Plan H/O Breast Cancer  Acute cholecystitis We will proceed with lap chole with IOC given an elevated TB of 2.4, although [place] doesn't show CBD dilatation or evidence of choledocholithiasis.  A translator has been used to explain the proceed as well as risks and complication.  She understands and is agreeable to proceed.  She has received Rocephin in the ED already.     FEN - NPO VTE - to start following surgery ID - Rocephin Admit - observation  Henreitta Cea, [facility] 05/19/2019, 11:14 AM Please see Amion for pager number during day hours 7:00am-4:30pm or 7:00am -11:30am on weekends

## 2019-05-19 NOTE — Anesthesia Procedure Notes (Signed)
Procedure Name: Intubation Date/Time: 05/19/2019 3:16 PM Performed by: Janace Litten, CRNA Pre-anesthesia Checklist: Patient identified, Emergency Drugs available, Suction available and Patient being monitored Patient Re-evaluated:Patient Re-evaluated prior to induction Oxygen Delivery Method: Circle System Utilized Preoxygenation: Pre-oxygenation with 100% oxygen Induction Type: IV induction Ventilation: Mask ventilation without difficulty Laryngoscope Size: Mac and 3 Grade View: Grade I Tube type: Oral Tube size: 7.0 mm Number of attempts: 1 Airway Equipment and Method: Stylet Placement Confirmation: ETT inserted through vocal cords under direct vision,  positive ETCO2 and breath sounds checked- equal and bilateral Secured at: 21 cm Tube secured with: Tape Dental Injury: Teeth and Oropharynx as per pre-operative assessment

## 2019-05-19 NOTE — Discharge Instructions (Signed)
CIRUGIA LAPAROSCOPICA: INSTRUCCIONES DE POST OPERATORIO. ° °Revise siempre los documentos que le entreguen en el lugar donde se ha hecho la cirugia. ° °SI USTED NECESITA DOCUMENTOS DE INCAPACIDAD (DISABLE) O DE PERMISO FAMILAR (FAMILY LEAVE) NECESITA TRAERLOS A LA OFICINA PARA QUE SEAN PROCESADOS. °NO  SE LOS DE A SU DOCTOR. °1. A su alta del hospital se le dara una receta para controlar el dolor. Tomela como ha sido recetada, si la necesita. Si no la necesita puede tomar, Acetaminofen (Tylenol) o Ibuprofen (Advil) para aliviar dolor moderado. °2. Continue tomando el resto de sus medicinas. °3. Si necesita rellenar la receta, llame a la farmacia. ellos contactan a nuestra oficina pidiendo autorizacion. Este tipo de receta no pueden ser rellenadas despues de las  5pm o durante los fines de semana. °4. Con relacion a la dieta: debe ser ligera los primeros dias despues que llege a la casa. Ejemplo: sopas y galleticas. Tome bastante liquido esos dias. °5. La mayoria de los pacientes padecen de inflamacion y cambio de coloracion de la piel alrededor de las incisiones. esto toma dias en resolver.  pnerse una bolsa de hielo en el area affectada ayuda..  °6. Es comun tambien tener un poco de estrenimiento si esta tomado medicinas para el dolor. incremente la cantidad de liquidos a tomar y puede tomar (Colace) esto previene el problema. Si ya tiene estrenimiento, es decir no ha defecado en 48 horas, puede tomar un laxativo (Milk of Magnesia or Miralax) uselo como el paquete le explica. °7.  A menos que se le diga algo diferente. Remueva el bendaje a las 24-48 horas despues dela cirugia. y puede banarse en la ducha sin ningun problema. usted puede tener steri-strips (pequenas curitas transparentes en la piel puesta encima de la incision)  Estas banditas strips should be left on the skin for 7-10 days.   Si su cirujano puso pegamento encima de la incision usted puede banarse bajo la ducha en 24 horas. Este pegamento empezara a  caerse en las proximas 2-3 semanas. Si le pusieron suturas o presillas (grapos) estos seran quitados en su proxima cita en la oficina. . °a. ACTIVIDADES:  Puede hacer actividad ligera.  Como caminar , subir escaleras y poco a poco irlas incrementando tanto como las tolere. Puede tener relaciones sexuales cuando sea comfortable. No carge objetos pesados o haga esfuerzos que no sean aprovados por su doctor. °b. Puede manejar en cuanto no esta tomando medicamentos fuertes (narcoticos) para el dolor, pueda abrochar confortablemente el cinturon de seguridad, y pueda maniobrar y usar los pedales de su vehiculo con seguridad. °c. PUEDE REGRESAR A TRABAJAR  °8. Debe ver a su doctor para una cita de seguimiento en 2-3 semanas despues de la cirugia.  °9. OTRAS ISNSTRUCCIONES:___________________________________________________________________________________ °CUANDO LLAMAR A SU MEDICO: °1. FIEBRE mayor de  101.0 °2. No produccion de orina. °3. Sangramiento continue de la herida °4. Incremento de dolor, enrojecimientio o drenaje de la herida (incision) °5. Incremento de dolor abdominal. ° °The clinic staff is available to answer your questions during regular business hours.  Please don’t hesitate to call and ask to speak to one of the nurses for clinical concerns.  If you have a medical emergency, go to the nearest emergency room or call 911.  A surgeon from [facility] is always on call at the hospital. °[address], [address], [place], [place]  [address] ? [address] [address], [place], [place]   [address] °[phone] ? [phone] ? FAX [phone] °Web site: [email or web address] ° ° °

## 2019-05-19 NOTE — ED Provider Notes (Signed)
[facility] EMERGENCY DEPARTMENT Provider Note   CSN: [id number] Arrival date & time: 05/19/19  [id number]     History No chief complaint on file.  Spanish video interpreter used during visit. Karen Bullock is a 54 y.o. female presents today for right upper quadrant pain nausea and vomiting. Patient reports that over the last week she has had intermittent pain of the right upper quadrant after eating that gradually resolves. She reports that 2 days ago pain became more constant describes severe sharp pain radiating to right side of her back worsened with palpation and eating no alleviating factors. Reports multiple episodes of emesis with small amount of bloody streaks.  No fever/chills, chest pain/shortness of breath, diarrhea, fall/injury, dysuria/hematuria or any additional concerns.  Of note patient received second dose of Pfizer COVID-19 vaccine yesterday.  HPI     Past Medical History:  Diagnosis Date  . Breast cancer ([place])    Right Breast Cancer  . Breast cancer, right breast ([place]) 01/15/2008  . Cancer ([place])   . Dislocation of left shoulder joint   . Personal history of chemotherapy 2010   Right Breast Cancer  . Personal history of radiation therapy 2010   Right Breast Cancer    Patient Active Problem List   Diagnosis Date Noted  . Acute cholecystitis 05/19/2019  . Dysuria 10/07/2014  . Chest pain 05/26/2014  . Syncope and collapse 12/23/2012  . Breast cancer of lower-inner quadrant of right female breast ([place]) 10/15/2012  . Preventive measure 04/04/2011    Past Surgical History:  Procedure Laterality Date  . BREAST LUMPECTOMY Right 2010  . BREAST SURGERY  2010   lumpectomy     OB History   No obstetric history on file.     Family History  Problem Relation Age of Onset  . Diabetes Father   . Cancer Neg Hx   . Hypertension Neg Hx   . Heart disease Neg Hx   . Breast cancer Neg Hx     Social History   Tobacco Use  . Smoking status:  Never Smoker  . Smokeless tobacco: Never Used  Substance Use Topics  . Alcohol use: No  . Drug use: Yes    Home Medications Prior to Admission medications   Medication Sig Start Date End Date Taking? Authorizing Provider  acetaminophen (TYLENOL) 325 MG tablet Take 650 mg by mouth every 6 (six) hours as needed for mild pain, fever or headache.   Yes [provider]  b complex vitamins tablet Take 1 tablet by mouth daily.   Yes [provider]  acetaminophen-codeine (TYLENOL #3) 300-30 MG tablet Take 1-2 tablets by mouth every 6 (six) hours as needed (cough). Patient not taking: Reported on 05/19/2019 01/08/18   Dickie La, MD  loratadine (CLARITIN) 10 MG tablet Take 1 tablet (10 mg total) by mouth daily. Patient not taking: Reported on 05/19/2019 01/28/18   Dickie La, MD    Allergies    Pork-derived products  Review of Systems   Review of Systems Ten systems are reviewed and are negative for acute change except as noted in the HPI  Physical Exam Updated Vital Signs BP 129/78   Pulse 75   Temp 98.7 F (37.1 C) (Oral)   Resp 19   Ht 5\' 3"  (1.6 m)   Wt 68 kg   LMP 07/08/2011   SpO2 93%   BMI 26.57 kg/m   Physical Exam Constitutional:      General: She is not  in acute distress.    Appearance: Normal appearance. She is well-developed. She is obese. She is not ill-appearing or diaphoretic.  HENT:     Head: Normocephalic and atraumatic.     Right Ear: External ear normal.     Left Ear: External ear normal.     Nose: Nose normal.  Eyes:     General: Vision grossly intact. Gaze aligned appropriately.     Pupils: Pupils are equal, round, and reactive to light.  Neck:     Trachea: Trachea and phonation normal. No tracheal deviation.  Pulmonary:     Effort: Pulmonary effort is normal. No respiratory distress.  Abdominal:     General: There is no distension.     Palpations: Abdomen is soft.     Tenderness: There is abdominal tenderness in the right upper  quadrant. There is guarding. There is no right CVA tenderness, left CVA tenderness or rebound. Positive signs include Murphy's sign. Negative signs include McBurney's sign.  Musculoskeletal:        General: Normal range of motion.     Cervical back: Normal range of motion.  Skin:    General: Skin is warm and dry.  Neurological:     Mental Status: She is alert.     GCS: GCS eye subscore is 4. GCS verbal subscore is 5. GCS motor subscore is 6.     Comments: Speech is clear and goal oriented, follows commands Major Cranial nerves without deficit, no facial droop Moves extremities without ataxia, coordination intact  Psychiatric:        Behavior: Behavior normal.     ED Results / Procedures / Treatments   Labs (all labs ordered are listed, but only abnormal results are displayed) Labs Reviewed  CBC WITH DIFFERENTIAL/PLATELET - Abnormal; Notable for the following components:      Result Value   RBC 3.47 (*)    Hemoglobin 10.7 (*)    HCT 32.5 (*)    RDW 17.3 (*)    nRBC 0.9 (*)    Abs Immature Granulocytes 0.98 (*)    All other components within normal limits  COMPREHENSIVE METABOLIC PANEL - Abnormal; Notable for the following components:   Glucose, Bld 147 (*)    Total Bilirubin 2.4 (*)    All other components within normal limits  URINALYSIS, ROUTINE W REFLEX MICROSCOPIC - Abnormal; Notable for the following components:   Color, Urine STRAW (*)    Hgb urine dipstick SMALL (*)    Ketones, ur 5 (*)    All other components within normal limits  SARS CORONAVIRUS 2 BY RT PCR (HOSPITAL ORDER, [facility])  LIPASE, BLOOD  PATHOLOGIST SMEAR REVIEW  POC URINE PREG, ED    EKG EKG Interpretation  Date/Time:  Wednesday May 19 2019 06:34:47 EDT Ventricular Rate:  73 PR Interval:    QRS Duration: 97 QT Interval:  399 QTC Calculation: 440 R Axis:   60 Text Interpretation: Sinus rhythm RSR' in V1 or V2, right VCD or RVH ST elevation, consider inferior  injury Baseline wander Confirmed by Fredia Sorrow [phone]) on 05/19/2019 7:29:31 AM   Radiology US Abdomen Limited RUQ  Result Date: 05/19/2019 CLINICAL DATA:  Right upper quadrant pain EXAM: ULTRASOUND ABDOMEN LIMITED RIGHT UPPER QUADRANT COMPARISON:  None. FINDINGS: Gallbladder: Cholelithiasis with the largest gallstone measuring 7 mm. Severely thickened gallbladder wall measuring up to 13.6 mm along the fundus. Negative sonographic Murphy sign. Common bile duct: Diameter: 4.4 mm Liver: No focal lesion identified.  Within normal limits in parenchymal echogenicity. Portal vein is patent on color Doppler imaging with normal direction of blood flow towards the liver. Other: None. IMPRESSION: Cholelithiasis with severe gallbladder wall thickening concerning for cholecystitis. Negative sonographic Murphy sign. If there is further clinical concern, recommend evaluation with a HIDA scan. Electronically Signed   By: Kathreen Devoid   On: 05/19/2019 08:30    Procedures Procedures (including critical care time)  Medications Ordered in ED Medications  morphine 2 MG/ML injection 1-4 mg (has no administration in time range)  ondansetron (ZOFRAN) injection 4 mg (has no administration in time range)  dextrose 5 % and 0.45% NaCl 1,000 mL with potassium chloride 20 mEq infusion (has no administration in time range)  sodium chloride 0.9 % bolus 1,000 mL (0 mLs Intravenous Stopped 05/19/19 0847)  ondansetron (ZOFRAN) injection 4 mg (4 mg Intravenous Given 05/19/19 0658)  morphine 4 MG/ML injection 4 mg (4 mg Intravenous Given 05/19/19 0726)  cefTRIAXone (ROCEPHIN) 2 g in sodium chloride 0.9 % 100 mL IVPB (0 g Intravenous Stopped 05/19/19 1052)  morphine 4 MG/ML injection 4 mg (4 mg Intravenous Given 05/19/19 1104)    ED Course  I have reviewed the triage vital signs and the nursing notes.  Pertinent labs & imaging results that were available during my care of the patient were reviewed by me and considered in my  medical decision making (see chart for details).  Clinical Course as of May 18 1113  Wed May 19, 2019  [facility]   [BM]    Clinical Course User Index [BM] Gari Crown   MDM Rules/Calculators/A&P                      54 year old female presents with right upper quadrant pain that was intermittent for the past week after eating now more constant over the last 2 days. She is uncomfortable appearing with right upper quadrant tenderness on exam and a positive Murphy sign. Will obtain labs including CBC, CMP, lipase, urinalysis. Additionally high suspicion for gallbladder etiology at this time will obtain right upper quadrant ultrasound. 1 L fluids given for suspected dehydration, 4 mg morphine and 4 mg Zofran for pain and nausea. Patient does not appear toxic/septic, vital signs stable no fever, tachycardia or hypotension. - I have ordered/reviewed/interpreted the following labs. Urine pregnancy test negative. Urinalysis shows small hemoglobin and 5 ketones, suspect secondary to dehydration, no evidence of infection. CBC shows mild anemia of 10.7, no leukocytosis. Lipase within normal limits no evidence of pancreatitis. CMP shows total bilirubin of 2.4 otherwise LFTs within normal limits, no emergent electrolyte derangement or evidence of acute kidney injury.  Right upper quadrant ultrasound:    IMPRESSION:  Cholelithiasis with severe gallbladder wall thickening concerning  for cholecystitis. Negative sonographic Murphy sign. If there is  further clinical concern, recommend evaluation with a HIDA scan.  Patient had positive Murphy sign on my examination. - Concern for cholecystitis, consult placed to general surgery. 2 g Rocephin ordered. Spanish video interpreter used, patient was updated on plan of care and is agreeable to general surgery consultation. She has no additional complaints or concerns, she reports pain and nausea improved following morphine and Zofran  given earlier. - 9:06 AM: Discussed case with Claiborne Billings with general surgery, coming to see patient. - Case discussed with Dr. Rogene Houston during this visit who agrees with plan.  Portions of this report may have been transcribed using voice recognition software. Every effort was  made to ensure accuracy; however, inadvertent computerized transcription errors may still be present. Final Clinical Impression(s) / ED Diagnoses Final diagnoses:  RUQ abdominal pain    Rx / DC Orders ED Discharge Orders    None       Gari Crown 05/19/19 1117    Fredia Sorrow, MD 05/22/19 [phone]

## 2019-05-20 DIAGNOSIS — Z853 Personal history of malignant neoplasm of breast: Secondary | ICD-10-CM | POA: Diagnosis not present

## 2019-05-20 DIAGNOSIS — Z20822 Contact with and (suspected) exposure to covid-19: Secondary | ICD-10-CM | POA: Diagnosis not present

## 2019-05-20 DIAGNOSIS — K828 Other specified diseases of gallbladder: Secondary | ICD-10-CM | POA: Diagnosis not present

## 2019-05-20 DIAGNOSIS — Z91018 Allergy to other foods: Secondary | ICD-10-CM | POA: Diagnosis not present

## 2019-05-20 DIAGNOSIS — Z9221 Personal history of antineoplastic chemotherapy: Secondary | ICD-10-CM | POA: Diagnosis not present

## 2019-05-20 DIAGNOSIS — D649 Anemia, unspecified: Secondary | ICD-10-CM | POA: Diagnosis not present

## 2019-05-20 DIAGNOSIS — K8012 Calculus of gallbladder with acute and chronic cholecystitis without obstruction: Secondary | ICD-10-CM | POA: Diagnosis not present

## 2019-05-20 DIAGNOSIS — R1011 Right upper quadrant pain: Secondary | ICD-10-CM | POA: Diagnosis not present

## 2019-05-20 DIAGNOSIS — K810 Acute cholecystitis: Secondary | ICD-10-CM | POA: Diagnosis not present

## 2019-05-20 DIAGNOSIS — Z923 Personal history of irradiation: Secondary | ICD-10-CM | POA: Diagnosis not present

## 2019-05-20 DIAGNOSIS — Z833 Family history of diabetes mellitus: Secondary | ICD-10-CM | POA: Diagnosis not present

## 2019-05-20 LAB — PATHOLOGIST SMEAR REVIEW

## 2019-05-20 MED ORDER — OXYCODONE HCL 5 MG PO TABS: 5.0000 mg | ORAL_TABLET | ORAL | 0 refills | Status: DC | PRN

## 2019-05-20 MED ORDER — SODIUM CHLORIDE 0.9 % IV BOLUS
500.0000 mL | Freq: Once | INTRAVENOUS | Status: AC
  Administered 2019-05-20: 500 mL via INTRAVENOUS

## 2019-05-20 MED ORDER — ACETAMINOPHEN 500 MG PO TABS: 1000.0000 mg | ORAL_TABLET | Freq: Four times a day (QID) | ORAL | 0 refills | Status: AC

## 2019-05-20 NOTE — Progress Notes (Signed)
Karen Bullock to be D/C'd per MD order. Discussed with the patient and all questions fully answered. ? VSS, Skin clean, dry and intact without evidence of skin break down, no evidence of skin tears noted. ? IV catheter discontinued intact. Site without signs and symptoms of complications. Dressing and pressure applied. ? An After Visit Summary was printed and given to the patient. Patient informed where to pickup prescriptions. ? D/C education completed with patient/family including follow up instructions, medication list, d/c activities limitations if indicated, with other d/c instructions as indicated by MD - patient able to verbalize understanding, all questions fully answered.  ? Patient instructed to return to ED, call 911, or call MD for any changes in condition.  ? Patient to be escorted via [place], and D/C home via private auto.

## 2019-05-20 NOTE — Discharge Summary (Signed)
    Patient ID: Karen Bullock [id number] Mar 30, 1965 54 y.o.  Admit date: 05/19/2019 Discharge date: 05/20/2019  Admitting Diagnosis: cholecystitis  Discharge Diagnosis Patient Active Problem List   Diagnosis Date Noted  . Acute cholecystitis 05/19/2019  . Acute calculous cholecystitis 05/19/2019  . Dysuria 10/07/2014  . Chest pain 05/26/2014  . Syncope and collapse 12/23/2012  . Breast cancer of lower-inner quadrant of right female breast ([place]) 10/15/2012  . Preventive measure 04/04/2011    Consultants none  Reason for Admission: This is a pleasant 54 yo Hispanic female with a past history of breast cancer who began having post prandial epigastric pain radiating to her back about a week ago.  It improved the following day, but began again the next day.  She then developed significant nausea and vomiting.  Denies diarrhea.  Had some subjective fevers and chills with vomiting.  She denies any other complaints.  Due to worsening pain, she presented to the ED today.  She has a normal WBC and LFTs except a TB of 2.4.  She has an abdomina US that reveals a gallbladder wall at 13.48mm thick and cholelithiasis.  We have been asked to see her for surgical management.  Procedures Lap chole with IOC, Dr. Kieth Brightly 05/19/19  Hospital Course:  The patient was admitted and underwent a laparoscopic cholecystectomy with IOC.  The patient tolerated the procedure well.  On POD 1, the patient was tolerating a regular diet, voiding well, mobilizing, and pain was controlled with oral pain medications.  The patient was stable for DC home at this time with appropriate follow up made.   Physical Exam: See progress note from earlier today  Allergies as of 05/20/2019      Reactions   Pork-derived Products Itching   Rash, itching      Medication List    STOP taking these medications   acetaminophen-codeine 300-30 MG tablet Commonly known as: TYLENOL #3   loratadine 10 MG tablet Commonly known as:  CLARITIN     TAKE these medications   acetaminophen 325 MG tablet Commonly known as: TYLENOL Take 650 mg by mouth every 6 (six) hours as needed for mild pain, fever or headache. What changed: Another medication with the same name was added. Make sure you understand how and when to take each.   acetaminophen 500 MG tablet Commonly known as: TYLENOL Take 2 tablets (1,000 mg total) by mouth every 6 (six) hours. What changed: You were already taking a medication with the same name, and this prescription was added. Make sure you understand how and when to take each.   b complex vitamins tablet Take 1 tablet by mouth daily.   oxyCODONE 5 MG immediate release tablet Commonly known as: Oxy IR/ROXICODONE Take 1 tablet (5 mg total) by mouth every 4 (four) hours as needed for moderate pain or severe pain (5mg  for moderate pain, 10mg  for severe pain).        Follow-up Information    Sur[facility], [place]. Go on 06/10/2019.   Specialty: General Surgery Why: 06/03 at 1:30 pm. Please arrive 30 minutes prior to your appointment for paperwork. Please bring a copy of your photo ID and insurance card to the appointment.  Contact information: [address] [place] [address] [phone]           Signed: Saverio Danker, [facility] 05/20/2019, 1:36 PM Please see Amion for pager number during day hours 7:00am-4:30pm, 7-11:30am on Weekends

## 2019-05-20 NOTE — Progress Notes (Signed)
Tried to walk pt up the hallway, pt did not tolerate it. Pt felt dizzy and weak. Assisted pt back to her room. Vitals rechecked. BP 100/68, HR 58, RR 15, 91 room air. Oxygen administered at 2L./min. will monitor pt.

## 2019-05-20 NOTE — Anesthesia Postprocedure Evaluation (Addendum)
Anesthesia Post Note  Patient: Karen Bullock  Procedure(s) Performed: LAPAROSCOPIC CHOLECYSTECTOMY WITH INTRAOPERATIVE CHOLANGIOGRAM (N/A Abdomen)     Patient location during evaluation: PACU Anesthesia Type: General Level of consciousness: awake and alert Pain management: pain level controlled Vital Signs Assessment: post-procedure vital signs reviewed and stable Respiratory status: spontaneous breathing, nonlabored ventilation, respiratory function stable and patient connected to nasal cannula oxygen Cardiovascular status: blood pressure returned to baseline and stable Postop Assessment: no apparent nausea or vomiting Anesthetic complications: no    Last Vitals:  Vitals:   05/20/19 0809 05/20/19 1511  BP: 96/63 95/60  Pulse: 73 73  Resp:    Temp: 37 C 37.1 C  SpO2: 95% 95%    Last Pain:  Vitals:   05/20/19 1511  TempSrc: Oral  PainSc:                  [name]

## 2019-05-20 NOTE — Progress Notes (Signed)
1 Day Post-Op  Subjective: CC: Dizziness Patient reports that when she tried to walk this morning she had an episode of dizziness and felt weak. No syncope. Symptoms have now resolved. No current CP, SOB, dizziness. Not associated with n/v. She has not ambulated since this event. She reports abdominal pain is mild and describes this as soreness. She is tolerating her diet. Voiding without difficulty. No other complaints.   Objective: Vital signs in last 24 hours: Temp:  [97.2 F (36.2 C)-98.6 F (37 C)] 98.6 F (37 C) (05/13 0809) Pulse Rate:  [58-101] 73 (05/13 0809) Resp:  [15-25] 18 (05/13 0613) BP: (93-130)/(59-87) 96/63 (05/13 0809) SpO2:  [91 %-100 %] 95 % (05/13 0809)    Orthostatic VS for the past 24 hrs:  BP- Lying Pulse- Lying BP- Sitting Pulse- Sitting BP- Standing at 0 minutes Pulse- Standing at 0 minutes  05/20/19 0919 94/57 71 102/59 93 102/68 77    Intake/Output from previous day: 05/12 0701 - 05/13 0700 In: 3404.7 [P.O.:120; I.V.:2184.7; IV Piggyback:1100] Out: 60 [Blood:60] Intake/Output this shift: Total I/O In: 195.4 [I.V.:195.4] Out: -   PE: Gen:  Alert, NAD, pleasant HEENT: EOM's intact, pupils equal and round Card:  RRR Pulm:  CTAB, no W/R/R, effort normal Abd: Soft, NT/ND, +BS, Incisions with glue intact appears well and are without drainage, bleeding, or signs of infection Ext:  No LE edema  Psych: A&Ox3  Skin: no rashes noted, warm and dry Neuro: CN 3-12 grossly intact. Moves all extremities  Lab Results:  Recent Labs    05/19/19 0627  WBC 8.7  HGB 10.7*  HCT 32.5*  PLT 198   BMET Recent Labs    05/19/19 0627  NA 136  K 4.1  CL 100  CO2 22  GLUCOSE 147*  BUN 13  CREATININE 0.89  CALCIUM 9.5   PT/INR No results for input(s): LABPROT, INR in the last 72 hours. CMP     Component Value Date/Time   NA 136 05/19/2019 0627   NA 141 01/28/2018 0927   NA 141 10/28/2013 1528   K 4.1 05/19/2019 0627   K 3.7 10/28/2013 1528    CL 100 05/19/2019 0627   CL 108 (H) 10/01/2011 1322   CO2 22 05/19/2019 0627   CO2 29 10/28/2013 1528   GLUCOSE 147 (H) 05/19/2019 0627   GLUCOSE 108 10/28/2013 1528   GLUCOSE 96 10/01/2011 1322   BUN 13 05/19/2019 0627   BUN 17 01/28/2018 0927   BUN 11.8 10/28/2013 1528   CREATININE 0.89 05/19/2019 0627   CREATININE 0.83 10/18/2014 0840   CREATININE 0.9 10/28/2013 1528   CALCIUM 9.5 05/19/2019 0627   CALCIUM 9.6 10/28/2013 1528   PROT 7.8 05/19/2019 0627   PROT 7.1 01/28/2018 0927   PROT 7.4 10/28/2013 1528   ALBUMIN 4.2 05/19/2019 0627   ALBUMIN 4.2 01/28/2018 0927   ALBUMIN 3.9 10/28/2013 1528   AST 20 05/19/2019 0627   AST 16 10/28/2013 1528   ALT 23 05/19/2019 0627   ALT 20 10/28/2013 1528   ALKPHOS 70 05/19/2019 0627   ALKPHOS 79 10/28/2013 1528   BILITOT 2.4 (H) 05/19/2019 0627   BILITOT 0.7 01/28/2018 0927   BILITOT 0.96 10/28/2013 1528   GFRNONAA >60 05/19/2019 0627   GFRNONAA 82 10/18/2014 0840   GFRAA >60 05/19/2019 0627   GFRAA >89 10/18/2014 0840   Lipase     Component Value Date/Time   LIPASE 17 05/19/2019 0627       Studies/Results:  DG Cholangiogram Operative  Result Date: 05/19/2019 CLINICAL DATA:  Cholelithiasis and evidence for cholecystitis on recent ultrasound. EXAM: INTRAOPERATIVE CHOLANGIOGRAM TECHNIQUE: Cholangiographic images from the C-arm fluoroscopic device were submitted for interpretation post-operatively. Please see the procedural report for the amount of contrast and the fluoroscopy time utilized. COMPARISON:  Ultrasound 05/19/2019 FINDINGS: Cannulation and opacification of the cystic duct. Contrast fills the common bile duct and common hepatic duct. Contrast refluxes into the intrahepatic ducts. Contrast drains into the duodenum and no large filling defects or stones. Extravasation of contrast near the cystic duct cannulation site. Small amount of contrast draining around the inferior margin of the liver. IMPRESSION: 1. Biliary system  is patent. No evidence for large stones or filling defects in the common bile duct. 2. Extravasation of contrast at the cystic duct cannulation site. Electronically Signed   By: Markus Daft M.D.   On: 05/19/2019 16:45   US Abdomen Limited RUQ  Result Date: 05/19/2019 CLINICAL DATA:  Right upper quadrant pain EXAM: ULTRASOUND ABDOMEN LIMITED RIGHT UPPER QUADRANT COMPARISON:  None. FINDINGS: Gallbladder: Cholelithiasis with the largest gallstone measuring 7 mm. Severely thickened gallbladder wall measuring up to 13.6 mm along the fundus. Negative sonographic Murphy sign. Common bile duct: Diameter: 4.4 mm Liver: No focal lesion identified. Within normal limits in parenchymal echogenicity. Portal vein is patent on color Doppler imaging with normal direction of blood flow towards the liver. Other: None. IMPRESSION: Cholelithiasis with severe gallbladder wall thickening concerning for cholecystitis. Negative sonographic Murphy sign. If there is further clinical concern, recommend evaluation with a HIDA scan. Electronically Signed   By: Kathreen Devoid   On: 05/19/2019 08:30    Anti-infectives: Anti-infectives (From admission, onward)   Start     Dose/Rate Route Frequency Ordered Stop   05/19/19 0845  cefTRIAXone (ROCEPHIN) 2 g in sodium chloride 0.9 % 100 mL IVPB     2 g 200 mL/hr over 30 Minutes Intravenous  Once 05/19/19 0844 05/19/19 1052       Assessment/Plan H/O Breast Cancer  Acute Cholecystitis S/p Laparoscopic Cholecystectomy with IOC - Dr. Kieth Brightly - 05/19/2019 POD #1 - Neg IOC - Episode of dizziness with ambulation and soft BP. Will give bolus and recheck for possible d/c later this AM if symptoms improve and patient is able to ambulate. - Patient is otherwise doing well. Voiding without difficulty, tolerating diet, pain well controlled, incisions c/d/i   FEN - HH VTE - SCDs, Lovenox ID - Rocephin peri-op Follow-Up - DOW   LOS: 0 days    Jillyn Ledger , [facility]gery 05/20/2019, 9:25 AM Please see Amion for pager number during day hours 7:00am-4:30pm

## 2019-05-21 LAB — SURGICAL PATHOLOGY

## 2019-06-10 DIAGNOSIS — R109 Unspecified abdominal pain: Secondary | ICD-10-CM | POA: Diagnosis not present

## 2019-08-25 ENCOUNTER — Ambulatory Visit (INDEPENDENT_AMBULATORY_CARE_PROVIDER_SITE_OTHER): Payer: BC Managed Care – PPO | Admitting: Family Medicine

## 2019-08-25 ENCOUNTER — Other Ambulatory Visit: Payer: Self-pay

## 2019-08-25 DIAGNOSIS — J069 Acute upper respiratory infection, unspecified: Secondary | ICD-10-CM | POA: Diagnosis not present

## 2019-08-25 MED ORDER — ACETAMINOPHEN-CODEINE 300-30 MG PO TABS: 1.0000 | ORAL_TABLET | ORAL | 0 refills | Status: AC | PRN

## 2019-08-25 NOTE — Assessment & Plan Note (Addendum)
Assessment: 54 year old female with 2 weeks history of sore throat, cough, fatigue.  No fevers, no sick contacts.  Patient has had both Covid vaccinations prior to May of this past year.  Patient denies vomiting.  Physical exam shows no respiratory distress, lungs sound clear on auscultation.  Patient overall well-appearing but has a cough which is her greatest complaint at this time.  With no signs of fevers, no respiratory abnormalities on physical exam and patient overall well-appearing do not feel this requires a chest x-ray as there is a low concern for bacterial pneumonia.  Patient symptoms are consistent with COVID-19 as well as a number of other viral URI infections. Plan: -Do feel this patient should have a Covid test even though she has been vaccinated against Covid as her symptoms are consistent with COVID-19 infection -Provide patient with information in order to get her COVID-19 testing -I provided patient with Tylenol 3 to assist with her cough for the next 5 days to use as needed. -Return precautions provided to patient for her to go to the emergency department should you get any respiratory distress, chest pains, or worsening symptoms.

## 2019-08-25 NOTE — Progress Notes (Addendum)
    SUBJECTIVE:   CHIEF COMPLAINT / HPI:   Cough: Patient is a 54 year old female presents today to discuss cough. Started with sore throat 2 weeks ago. Cough more present when moving. Coughing started 8 days ago. Not productive. No fevers. Feels shortness of breath, never had before. No chest pain. No sick contacts. Had both Covid vaccinations before May. Some nausea when coughing but no vomiting. No rhiinorrhea  Due to language barrier, an interpreter was present during the history-taking and subsequent discussion (and for part of the physical exam) with this patient.     PERTINENT  PMH / PSH: None relevant  OBJECTIVE:   BP 112/62   Pulse (!) 110   LMP 07/08/2011   SpO2 95%    Pulse recheck 95  General: NAD, pleasant, able to participate in exam, no pharyngeal erythema, no lymphadenopathy Cardiac: RRR, no murmurs. Respiratory: CTAB, no respiratory distress, normal effort, no wheezing, no crackles Skin: warm and dry, no rashes noted Psych: Normal affect and mood  ASSESSMENT/PLAN:   Viral URI with cough Assessment: 54 year old female with 2 weeks history of sore throat, cough, fatigue.  No fevers, no sick contacts.  Patient has had both Covid vaccinations prior to May of this past year.  Patient denies vomiting.  Physical exam shows no respiratory distress, lungs sound clear on auscultation.  Patient overall well-appearing but has a cough which is her greatest complaint at this time. Plan: -Do feel this patient should have a Covid test even though she has been vaccinated against Covid as her symptoms are consistent with COVID-19 infection -Provide patient with information in order to get her COVID-19 testing -I provided patient with Tylenol 3 to assist with her cough for the next 5 days to use as needed. -Return precautions provided to patient for her to go to the emergency department should you get any respiratory distress, chest pains, or worsening symptoms.     Provide  patient with information about how long to stay out of work pending her COVID-19 results and what to do should that be positive.  Lurline Del, [place]

## 2019-08-25 NOTE — Patient Instructions (Signed)
It was great to see you!  Our plans for today:  -I would like for you to get a Covid 19 test. -I am providing information below about how to do so -Is recommended to quarantine until the results of this test come back negative -I am providing medication to your pharmacy to help with your cough for the next 5 days -It important that if your cough and shortness of breath worsens that you return or go to the emergency department.  Take care and seek immediate care sooner if you develop any concerns.   Dr. [facility]   We recommend you undergo testing for COVID19. This is by appointment only.   Go to [email or web address] and schedule and appointment  You can also call [phone].    You should quarantine (isolate at home) until the following criteria are met: At least 10 days since symptoms first appeared and At least 24 hours with no fever without fever-reducing medication and Other symptoms of COVID-19 are improving (Loss of taste and smell may persist for weeks or months after recovery and need not delay the end of isolation)  When to seek emergency medical attention Look for emergency warning signs* for COVID-19. If your or someone you know is showing any of these signs, seek emergency medical care immediately:  Trouble breathing Persistent pain or pressure in the chest New confusion Inability to wake or stay awake Bluish lips or face Inability to tolerate fluids  *This list is not all possible symptoms. We discussed additional symptoms   Call 911 or call ahead to your local emergency facility: Notify the operator that you are seeking care for someone who has or may have COVID-19.  Recommend that patients schedule an appointment to be tested in one of the following ways:    Patient can call [phone] to schedule and appointment.   [email or web address]  This link takes patient to  online calendar to schedule   Patient advised of hours 8am-3:30pm and that they should be in line by 3 PM. -ED precautions discussed and patient expressed good understanding -Patient counseled on wearing a mask and frequently washing hands -Patient instructed to avoid others until they meet criteria for ending isolation after any suspected COVID, which are:  -24 hours with no fever (without medications) and  -Respiratory symptoms have resolved (e.g. cough, shortness of breath) and -10 days since symptoms first appeared

## 2019-08-27 ENCOUNTER — Other Ambulatory Visit: Payer: Self-pay

## 2019-08-27 ENCOUNTER — Other Ambulatory Visit: Payer: BC Managed Care – PPO

## 2019-08-27 DIAGNOSIS — Z20822 Contact with and (suspected) exposure to covid-19: Secondary | ICD-10-CM

## 2019-08-28 LAB — SARS-COV-2, NAA 2 DAY TAT

## 2019-08-28 LAB — NOVEL CORONAVIRUS, NAA: SARS-CoV-2, NAA: NOT DETECTED

## 2019-09-19 ENCOUNTER — Other Ambulatory Visit: Payer: Self-pay

## 2019-09-19 ENCOUNTER — Encounter (HOSPITAL_COMMUNITY): Payer: Self-pay | Admitting: Emergency Medicine

## 2019-09-19 ENCOUNTER — Emergency Department (HOSPITAL_COMMUNITY)
Admission: EM | Admit: 2019-09-19 | Discharge: 2019-09-19 | Disposition: A | Discharge status: Home / Self Care | Payer: BC Managed Care – PPO | Attending: Emergency Medicine | Admitting: Emergency Medicine

## 2019-09-19 DIAGNOSIS — R55 Syncope and collapse: Secondary | ICD-10-CM | POA: Insufficient documentation

## 2019-09-19 DIAGNOSIS — Z5321 Procedure and treatment not carried out due to patient leaving prior to being seen by health care provider: Secondary | ICD-10-CM | POA: Diagnosis not present

## 2019-09-19 LAB — CBC
HCT: 24 % — ABNORMAL LOW (ref 36.0–46.0)
Hemoglobin: 7.3 g/dL — ABNORMAL LOW (ref 12.0–15.0)
MCH: 26.9 pg (ref 26.0–34.0)
MCHC: 30.4 g/dL (ref 30.0–36.0)
MCV: 88.6 fL (ref 80.0–100.0)
Platelets: 348 10*3/uL (ref 150–400)
RBC: 2.71 MIL/uL — ABNORMAL LOW (ref 3.87–5.11)
RDW: 24.2 % — ABNORMAL HIGH (ref 11.5–15.5)
WBC: 7.3 10*3/uL (ref 4.0–10.5)
nRBC: 2.9 % — ABNORMAL HIGH (ref 0.0–0.2)

## 2019-09-19 LAB — I-STAT BETA HCG BLOOD, ED (MC, WL, AP ONLY): I-stat hCG, quantitative: <5 m[IU]/mL (ref <5)

## 2019-09-19 LAB — BASIC METABOLIC PANEL
Anion gap: 8 (ref 5–15)
BUN: 13 mg/dL (ref 6–20)
CO2: 27 mmol/L (ref 22–32)
Calcium: 8.8 mg/dL — ABNORMAL LOW (ref 8.9–10.3)
Chloride: 104 mmol/L (ref 98–111)
Creatinine, Ser: 0.94 mg/dL (ref 0.44–1.00)
GFR calc Af Amer: >60 mL/min (ref >60)
GFR calc non Af Amer: >60 mL/min (ref >60)
Glucose, Bld: 119 mg/dL — ABNORMAL HIGH (ref 70–99)
Potassium: 4.1 mmol/L (ref 3.5–5.1)
Sodium: 139 mmol/L (ref 135–145)

## 2019-09-19 LAB — CBG MONITORING, ED: Glucose-Capillary: 115 mg/dL — ABNORMAL HIGH (ref 70–99)

## 2019-09-19 MED ORDER — SODIUM CHLORIDE 0.9% FLUSH: 3.0000 mL | Freq: Once | INTRAVENOUS | Status: DC

## 2019-09-19 NOTE — ED Notes (Signed)
Pt informed staff that she was leaving.

## 2019-09-19 NOTE — ED Triage Notes (Signed)
Pt had a syncopal episode after taking Tylenol #3.  States she felt dizzy and diaphoretic and passed out.  No neuro deficits.

## 2019-11-03 NOTE — Progress Notes (Signed)
    SUBJECTIVE:   CHIEF COMPLAINT / HPI: concern for weight loss  Weight loss Patient reports 30 pound weight loss since her cholecystectomy back in May 2021.  She had decreased appetite which has recently started to come back.  She notes that she has started to gain weight in the past 2 to 3 weeks.  Denies any chest pain, shortness of breath or abdominal pain, nausea or vomiting.  Denies any vaginal bleeding, reports last menstrual period 10 years ago.  Denies any hematuria or hematochezia.  Was recently seen in clinic for viral URI treated with Tylenol 3's for cough with patient reports medication intolerance.  Last Pap smear 5 years ago in [place] and is unsure of results.  She is followed by oncology for her breast cancer which has been in remission since treated with chemo.  She has yearly mammograms, next one scheduled December 2021.  Is not up-to-date with colon cancer screening.  Denies any family history of colon cancer.  PERTINENT  PMH / PSH:  Right Breast Ca treated with chemo and surgery  OBJECTIVE:   BP 112/62   Pulse 84   Ht 5\' 5"  (1.651 m)   Wt 139 lb 3.2 oz (63.1 kg)   LMP 07/08/2011   SpO2 98%   BMI 23.16 kg/m    General: Alert and oriented, no apparent distress   Eyes: PEERLA ENTM: No pharyengeal erythema Neck: nontender, no lymphadenopathy. Cardiovascular: RRR with no murmurs noted Respiratory: CTA bilaterally  Gastrointestinal: Bowel sounds present. No abdominal pain MSK: Upper extremity strength 5/5 bilaterally, Lower extremity strength 5/5 bilaterally  Psych: Behavior and speech appropriate to situation.  No suicidal or homicidal ideation.  ASSESSMENT/PLAN:   Weight loss, unintentional 30 pound weight loss in 5 months.  Slowly starting to increase weight.  No decrease in appetite or changes in nutrition.  PHQ 9 score 0 so less likely depression contributing to weight loss.  Given history of breast CA considered malignancy in differential.  Also considered  metabolic disorders and infectious process in differentials. -Hemoglobin 7.9 today, will start iron supplements daily. -Anemia panel, TSH, HIV, hep C labs today -A1c 5.8, prediabetic, will discuss with patient at next visit. -Referral for colonoscopy -Appointment for Pap smear in 2 weeks. -Return to lab in 1 week for repeat hemoglobin check       Carollee Leitz, MD [place]

## 2019-11-03 NOTE — Patient Instructions (Addendum)
Thank you for coming to see me today.  Your hemoglobin is 7.9 which is low. I would like you to start taking iron tablets daily and have sent in a prescription.  I will call you with the results of your blood work when received.   An appointment with me for a PAP on Nov 15 at 220pm    I have sent a referral to GI for a colonoscopy, they will call you with an appointment.  Follow up with lab appointment on Thurs Nov 4 at 830am.  I will call you with the results.  If you have any questions or concerns, please do not hesitate to call the office at [phone].  Best,   Carollee Leitz, MD Family Medicine Residency     Pruebas de deteccin de cncer colorrectal Colorectal Cancer Screening  Las pruebas de deteccin de cncer colorrectal se usan para confirmar o descartar este tipo de cncer antes de que se desarrollen los sntomas. Colorrectal se refiere al colon y al recto. El colon y el recto se encuentran al final del tubo digestivo y all se eliminan las deposiciones hacia fuera del cuerpo. Quin debe realizarse la prueba de deteccin? [employer] adultos a Proofreader de los 50 aos [facility] 75 aos deben hacerse pruebas de Programme researcher, broadcasting/film/video. El mdico puede recomendarle las pruebas de deteccin a partir de los 29 aos. Le realizarn pruebas cada 1 a 10 aos, segn los [place] y el tipo de prueba de Programme researcher, broadcasting/film/video. Puede realizarse pruebas de deteccin a partir de una edad ms temprana o con ms frecuencia que [employer], si usted tiene alguno de los siguientes factores de riesgo:  Antecedentes personales o familiares de cncer colorrectal o bultos anormales (plipos).  Una enfermedad inflamatoria del intestino, como colitis ulcerosa o enfermedad de Crohn.  Antecedentes de radioterapia en el abdomen o rea plvica para tratamiento de cncer.  Sntomas de Surveyor, minerals, como cambios en los hbitos intestinales o sangre en las heces.  Un tipo de sndrome de cncer de colon que se  transmite de padres a hijos (hereditario), como: ? Sndrome de Field seismologist. ? Poliposis adenomatosa familiar. ? Sndrome de [place]. ? Sndrome de Peutz-Jeghers. Las recomendaciones de pruebas de Programme researcher, broadcasting/film/video para los adultos que tienen entre 47 y 3 aos de edad [place] segn la salud. Cmo se realiza este estudio? Hay diversos tipos de pruebas de Manufacturing systems engineer. Pueden hacerle una o ms de las siguientes:  Prueba de sangre oculta en la materia fecal con guayacol. Para realizar esta prueba, se examina una muestra de heces (materia fecal) a fin de Consulting civil engineer oculta (escondida), lo que podra ser un signo de Surveyor, minerals.  Prueba inmunoqumica fecal (PIF). Para realizar esta prueba, se examina Truddie Coco de heces a fin de detectar la presencia de [place], lo que podra ser un signo de Surveyor, minerals.  Prueba de cido desoxirribonucleico (ADN) en heces. Para realizar esta prueba, se examina Truddie Coco de heces a fin de Product manager presencia de sangre y cambios en el ADN que podran Cabin crew.  Sigmoidoscopa. Durante esta prueba, se utiliza un tubo flexible y delgado con una cmara en el extremo (sigmoidoscopio) para examinar el recto y la parte inferior del colon.  Colonoscopa. Durante esta prueba, se utiliza un tubo largo y flexible con una cmara en el extremo (colonoscopio) para examinar todo el colon y el recto. Mediante una colonoscopa es posible tomar una muestra de tejido (biopsia) y extirpar pequeos plipos durante la prueba.  Colonoscopa virtual. En lugar de un colonoscopio, en este tipo de colonoscopa se utilizan radiografas (exploracin por tomografa computarizada (TC)) y computadoras para generar imgenes del colon y el recto. Cules son los beneficios de las pruebas de deteccin? Las pruebas de deteccin reducen el riesgo de cncer colorrectal y pueden ayudar a identificar el cncer en una etapa temprana, cuando se puede extirpar o  tratar con mayor facilidad. Es comn que se formen plipos en el revestimiento del colon, especialmente a medida que envejece. Estos plipos pueden ser cancerosos o volverse cancerosos con el [place]. Las pruebas de deteccin pueden identificar estos plipos. Cules son los riesgos de la prueba de deteccin? Cada prueba de deteccin puede [employer].  Las pruebas de muestras de heces tienen menos riesgos que otros tipos de pruebas de Programme researcher, broadcasting/film/video. Sin embargo, es posible que deba realizarse ms pruebas para [employer] de [place] prueba de [place] de [place].  Las pruebas de deteccin con radiografas lo exponen a niveles bajos de radiacin, lo que puede aumentar ligeramente el riesgo de Hotel manager. El beneficio de Futures trader cncer supera el ligero aumento del [place].  Las pruebas de Youth worker la sigmoidoscopa y Engineer, materials colonoscopa pueden conllevar riesgo de sangrado, dao intestinal, infeccin o una reaccin a los medicamentos administrados durante el examen. Consulte a su mdico para comprender su riesgo de Therapist, music y para elaborar un plan de deteccin que sea adecuado para usted. Preguntas para hacerle al mdico  Cundo debo comenzar con las pruebas de deteccin del cncer colorrectal?  Cul es mi riesgo de Best boy cncer colorrectal?  Con qu frecuencia tengo que realizarme pruebas de deteccin?  Qu pruebas de deteccin tengo que realizarme?  Cmo obtengo los resultados de la prueba?  [place] significan los resultados? Dnde buscar ms informacin Obtenga ms informacin sobre las pruebas de deteccin del Surveyor, minerals en los siguientes sitios:  Sociedad Museum/gallery conservator ([place]): www.cancer.[place] ([place]): www.cancer.gov Resumen  Las pruebas de deteccin de Surveyor, minerals se usan para Firefighter o Teaching laboratory technician tipo de cncer antes de que se desarrollen los  sntomas.  Las pruebas de deteccin reducen el riesgo de cncer colorrectal y pueden ayudar a identificar el cncer en una etapa temprana, cuando se puede extirpar o tratar con mayor facilidad.  [employer] adultos a Proofreader de los 50 aos [facility] 75 aos deben hacerse pruebas de Programme researcher, broadcasting/film/video. El mdico puede recomendarle las pruebas de deteccin a partir de los 21 aos.  Puede realizarse pruebas de deteccin a partir de una edad ms temprana o con ms frecuencia que [employer], si usted tiene determinados factores de Sales executive.  Consulte a su mdico para comprender su riesgo de Therapist, music y para elaborar un plan de deteccin que sea adecuado para usted. Esta informacin no tiene Marine scientist el consejo del mdico. Asegrese de hacerle al mdico cualquier pregunta que tenga. Document Revised: 01/29/2017 Document Reviewed: 11/22/2016 Elsevier Patient Education  2020 [employer].

## 2019-11-04 ENCOUNTER — Other Ambulatory Visit: Payer: Self-pay | Admitting: Oncology

## 2019-11-04 DIAGNOSIS — Z1231 Encounter for screening mammogram for malignant neoplasm of breast: Secondary | ICD-10-CM

## 2019-11-05 ENCOUNTER — Other Ambulatory Visit: Payer: Self-pay

## 2019-11-05 ENCOUNTER — Ambulatory Visit (INDEPENDENT_AMBULATORY_CARE_PROVIDER_SITE_OTHER): Payer: BC Managed Care – PPO | Admitting: Family Medicine

## 2019-11-05 ENCOUNTER — Encounter: Payer: Self-pay | Admitting: Family Medicine

## 2019-11-05 VITALS — BP 112/62 | HR 84 | Ht 65.0 in | Wt 139.2 lb

## 2019-11-05 DIAGNOSIS — R634 Abnormal weight loss: Secondary | ICD-10-CM | POA: Diagnosis not present

## 2019-11-05 LAB — POCT GLYCOSYLATED HEMOGLOBIN (HGB A1C): Hemoglobin A1C: 5.8 % — AB (ref 4.0–5.6)

## 2019-11-05 LAB — POCT HEMOGLOBIN: Hemoglobin: 7.9 g/dL — AB (ref 11–14.6)

## 2019-11-05 MED ORDER — FERROUS SULFATE 324 (65 FE) MG PO TBEC: DELAYED_RELEASE_TABLET | ORAL | 3 refills | Status: AC

## 2019-11-05 MED ORDER — POLYETHYLENE GLYCOL 3350 17 GM/SCOOP PO POWD: 17.0000 g | Freq: Two times a day (BID) | ORAL | 1 refills | Status: AC | PRN

## 2019-11-08 ENCOUNTER — Encounter: Payer: Self-pay | Admitting: Family Medicine

## 2019-11-08 DIAGNOSIS — R634 Abnormal weight loss: Secondary | ICD-10-CM | POA: Insufficient documentation

## 2019-11-08 NOTE — Assessment & Plan Note (Signed)
30 pound weight loss in 5 months.  Slowly starting to increase weight.  No decrease in appetite or changes in nutrition.  PHQ 9 score 0 so less likely depression contributing to weight loss.  Given history of breast CA considered malignancy in differential.  Also considered metabolic disorders and infectious process in differentials. -Hemoglobin 7.9 today, will start iron supplements daily. -Anemia panel, TSH, HIV, hep C labs today -A1c 5.8, prediabetic, will discuss with patient at next visit. -Referral for colonoscopy -Appointment for Pap smear in 2 weeks. -Return to lab in 1 week for repeat hemoglobin check

## 2019-11-09 ENCOUNTER — Telehealth: Payer: Self-pay | Admitting: Family Medicine

## 2019-11-09 ENCOUNTER — Other Ambulatory Visit: Payer: Self-pay | Admitting: Family Medicine

## 2019-11-09 DIAGNOSIS — R799 Abnormal finding of blood chemistry, unspecified: Secondary | ICD-10-CM

## 2019-11-09 LAB — ANEMIA PROFILE B
Basophils Absolute: 0 10*3/uL (ref 0.0–0.2)
Basos: 0 %
EOS (ABSOLUTE): 0.4 10*3/uL (ref 0.0–0.4)
Eos: 6 %
Ferritin: 246 ng/mL — ABNORMAL HIGH (ref 15–150)
Folate: 13 ng/mL (ref >3.0)
Hematocrit: 25.1 % — ABNORMAL LOW (ref 34.0–46.6)
Hemoglobin: 8.1 g/dL — ABNORMAL LOW (ref 11.1–15.9)
Iron Saturation: 11 % — ABNORMAL LOW (ref 15–55)
Iron: 31 ug/dL (ref 27–159)
Lymphocytes Absolute: 3.4 10*3/uL — ABNORMAL HIGH (ref 0.7–3.1)
Lymphs: 55 %
MCH: 26.9 pg (ref 26.6–33.0)
MCHC: 32.3 g/dL (ref 31.5–35.7)
MCV: 83 fL (ref 79–97)
Monocytes Absolute: 0.2 10*3/uL (ref 0.1–0.9)
Monocytes: 3 %
NRBC: 9 % — ABNORMAL HIGH (ref 0–0)
Neutrophils Absolute: 1.9 10*3/uL (ref 1.4–7.0)
Neutrophils: 31 %
Platelets: 229 10*3/uL (ref 150–450)
RBC: 3.01 x10E6/uL — ABNORMAL LOW (ref 3.77–5.28)
RDW: 24.2 % — ABNORMAL HIGH (ref 11.7–15.4)
Retic Ct Pct: 2 % (ref 0.6–2.6)
Total Iron Binding Capacity: 295 ug/dL (ref 250–450)
UIBC: 264 ug/dL (ref 131–425)
Vitamin B-12: >2000 pg/mL — ABNORMAL HIGH (ref 232–1245)
WBC: 6.1 10*3/uL (ref 3.4–10.8)

## 2019-11-09 LAB — IMMATURE CELLS
Blasts/blast like cells: 4 % — ABNORMAL HIGH (ref 0–0)
MEGAKARYOCYTES: 1 % — ABNORMAL HIGH (ref 0–0)
Metamyelocytes: 1 % — ABNORMAL HIGH (ref 0–0)

## 2019-11-09 LAB — TSH: TSH: 2.45 u[IU]/mL (ref 0.450–4.500)

## 2019-11-09 LAB — HIV ANTIBODY (ROUTINE TESTING W REFLEX): HIV Screen 4th Generation wRfx: NONREACTIVE

## 2019-11-09 LAB — HCV INTERPRETATION

## 2019-11-09 LAB — HCV AB W REFLEX TO QUANT PCR: HCV Ab: <0.1 s/co ratio (ref 0.0–0.9)

## 2019-11-09 NOTE — Telephone Encounter (Signed)
Results from recent CBC and smear shows 4% blasts.  Precepted patient with attending.  Spoke with Dr. Lindi Adie who recommended to call Dr Jana Hakim as patient was previously seen by provider.    Spoke with Dr Jana Hakim who recommended to call [facility] and talk to Leukemia specialist given concern for Acute Leukemia.   Spoke with Dr.Ellis, Heme/Onc at [facility] who recommended referral to Leukemia specialist at [facility] and they will arrange for an appointment for her to be seen.  Will book virtual appointment to discuss with patient results of labs and referral.  Carollee Leitz, MD [facility] Medicine Residency

## 2019-11-10 ENCOUNTER — Other Ambulatory Visit: Payer: Self-pay

## 2019-11-10 ENCOUNTER — Telehealth: Payer: BC Managed Care – PPO

## 2019-11-11 ENCOUNTER — Other Ambulatory Visit: Payer: BC Managed Care – PPO

## 2019-11-11 ENCOUNTER — Other Ambulatory Visit: Payer: Self-pay | Admitting: Family Medicine

## 2019-11-11 ENCOUNTER — Other Ambulatory Visit: Payer: Self-pay

## 2019-11-11 ENCOUNTER — Telehealth (INDEPENDENT_AMBULATORY_CARE_PROVIDER_SITE_OTHER): Payer: BC Managed Care – PPO | Admitting: Family Medicine

## 2019-11-11 DIAGNOSIS — R799 Abnormal finding of blood chemistry, unspecified: Secondary | ICD-10-CM

## 2019-11-11 DIAGNOSIS — R899 Unspecified abnormal finding in specimens from other organs, systems and tissues: Secondary | ICD-10-CM

## 2019-11-11 DIAGNOSIS — D649 Anemia, unspecified: Secondary | ICD-10-CM

## 2019-11-11 LAB — CBC
Hematocrit: 25.4 % — ABNORMAL LOW (ref 34.0–46.6)
Hemoglobin: 8.1 g/dL — ABNORMAL LOW (ref 11.1–15.9)
MCH: 26.5 pg — ABNORMAL LOW (ref 26.6–33.0)
MCHC: 31.9 g/dL (ref 31.5–35.7)
MCV: 83 fL (ref 79–97)
Platelets: 233 10*3/uL (ref 150–450)
RBC: 3.06 x10E6/uL — ABNORMAL LOW (ref 3.77–5.28)
RDW: 27.6 % — ABNORMAL HIGH (ref 11.7–15.4)
WBC: 7.3 10*3/uL (ref 3.4–10.8)

## 2019-11-11 NOTE — Progress Notes (Signed)
[facility] Telemedicine Visit  Patient consented to have virtual visit and was identified by name and date of birth. Method of visit: Telephone  Encounter participants: Patient: Starlee Corralejo - located at home Provider: Carollee Leitz - located at office Others (if applicable): Husband-Losa and Rosamaria Lints via [employer]  Follow up from this mornings conversation with patient regarding abnormal labs  I was able to connect with both Mr. And Mrs Spitzley this evening at her request from this morning.  Earlier today I had spoken with her about the results of abnormal labs.  She had been tearful but had taken the news well.  She wanted to connect this evening to allow for her husband to ask any questions regarding the results.  She had told him of the concern for a possible blood cancer and that she would be referred to a blood specialist.  Her husband had no specific questions just wanted to know when then appointment would be.  I explained that I had sent in an urgent referral and the Hematology office would call them for an appointment.  I also told them it would be important for them to answer the phone in the next week as Ms Grounds mentioned earlier that she does not answer if she is not sure of the number.  Both parties were appreciative of the phone call and how we were moving forward.    ROS: per HPI  Pertinent PMHx:  Right Breast Ca, lumpectomy and chemotherapy  Exam:  LMP 07/08/2011   Respiratory: No IWOB, speaking in full sentences  Assessment/Plan:  Abnormal laboratory test result Discussed with patient and husband through interpreter the results of recent labs and the need for an urgent heme/onc referral -will follow up next week to see if appointment has been scheduled    Time spent during visit with patient: 30 minutes

## 2019-11-14 ENCOUNTER — Telehealth: Payer: Self-pay | Admitting: Family Medicine

## 2019-11-14 ENCOUNTER — Encounter: Payer: Self-pay | Admitting: Family Medicine

## 2019-11-14 DIAGNOSIS — R899 Unspecified abnormal finding in specimens from other organs, systems and tissues: Secondary | ICD-10-CM | POA: Insufficient documentation

## 2019-11-14 NOTE — Assessment & Plan Note (Signed)
Discussed with patient and husband through interpreter the results of recent labs and the need for an urgent heme/onc referral -will follow up next week to see if appointment has been scheduled

## 2019-11-14 NOTE — Telephone Encounter (Signed)
I connected with Karen Bullock after her lab appointment on Thurs Nov 11, 2019 at 845am.  This conversation took place using interpreter services via [employer].   I initially asked how she had been doing since I had last seen her to which she responded "ok". I told her that I was concerned about some results from her blood work and asked if she wanted me to continue or if she would prefer to have her husband or family member join in the conversation.  She wanted to continue.   I discussed with her the recent results of her labs and the concern for possible blood disorder and cancer could not be ruled out.  I recommended that she be seen by a blood specialist for further evaluation.    She was tearful and did not have many questions.  She wanted to talk to her husband.  I recommended that we schedule this evening a telephone appointment to discuss any concerns and she agreed to this.    She was appreciative of the time and concern that was taken to get her the appropriate care she needed.  I will call later this evening to discuss and answer any questions that both Karen Bullock may have.  Referral sent to Heme/Onc, [facility].  Carollee Leitz, MD [facility]

## 2019-11-15 ENCOUNTER — Other Ambulatory Visit: Payer: Self-pay | Admitting: Oncology

## 2019-11-19 DIAGNOSIS — D759 Disease of blood and blood-forming organs, unspecified: Secondary | ICD-10-CM | POA: Diagnosis not present

## 2019-11-19 DIAGNOSIS — D649 Anemia, unspecified: Secondary | ICD-10-CM | POA: Diagnosis not present

## 2019-11-21 NOTE — Patient Instructions (Signed)
Thank you for coming to see me today. It was a pleasure.  I will call you with your PAP results  Please follow-up with PCP as needed  If you have any questions or concerns, please do not hesitate to call the office at [phone].  Best,   Carollee Leitz, MD [facility]

## 2019-11-21 NOTE — Progress Notes (Signed)
    SUBJECTIVE:   CHIEF COMPLAINT / HPI: for PAP smear  LMP 2013 Vaginal discharge-none Dyspareunia- none Abdominal Pain-none  Abnormal labs follow up Patient has been seen Heme/Onc.  She is scheduled for BM biopsy in a couple of day.  She reports feeling tired, but does not have any fevers, nausea or vomiting.    PERTINENT  PMH / PSH:  Right breast cancer treated with chemotherapy  OBJECTIVE:   BP 112/60   Pulse 67   Ht 5\' 5"  (1.651 m)   Wt 139 lb (63 kg)   LMP 07/08/2011   SpO2 97%   BMI 23.13 kg/m   General: Alert and oriented, no apparent distress  EVE: no lesion or abrasions noted SVE: cervix normal, no lesion noted, normal vaginal wall and mucose Bimanual: no CMT or adnexal masses, pelvic musculature wnl   ASSESSMENT/PLAN:   Cervical cancer screening PAP with HPV STI screening at patients request, [facility] Will call patient with result when available     Carollee Leitz, MD [facility]

## 2019-11-22 ENCOUNTER — Other Ambulatory Visit (HOSPITAL_COMMUNITY)
Admission: RE | Admit: 2019-11-22 | Discharge: 2019-11-22 | Disposition: A | Discharge status: Home / Self Care | Payer: BC Managed Care – PPO | Source: Ambulatory Visit | Attending: Family Medicine | Admitting: Family Medicine

## 2019-11-22 ENCOUNTER — Other Ambulatory Visit: Payer: Self-pay

## 2019-11-22 ENCOUNTER — Ambulatory Visit (INDEPENDENT_AMBULATORY_CARE_PROVIDER_SITE_OTHER): Payer: BC Managed Care – PPO | Admitting: Family Medicine

## 2019-11-22 VITALS — BP 112/60 | HR 67 | Ht 65.0 in | Wt 139.0 lb

## 2019-11-22 DIAGNOSIS — Z113 Encounter for screening for infections with a predominantly sexual mode of transmission: Secondary | ICD-10-CM

## 2019-11-22 DIAGNOSIS — Z124 Encounter for screening for malignant neoplasm of cervix: Secondary | ICD-10-CM | POA: Insufficient documentation

## 2019-11-22 LAB — POCT WET PREP (WET MOUNT)
Clue Cells Wet Prep Whiff POC: NEGATIVE
Trichomonas Wet Prep HPF POC: ABSENT

## 2019-11-23 ENCOUNTER — Encounter: Payer: Self-pay | Admitting: Family Medicine

## 2019-11-23 DIAGNOSIS — Z124 Encounter for screening for malignant neoplasm of cervix: Secondary | ICD-10-CM | POA: Insufficient documentation

## 2019-11-23 NOTE — Assessment & Plan Note (Signed)
PAP with HPV STI screening at patients request, GC&C Will call patient with result when available

## 2019-11-24 ENCOUNTER — Telehealth: Payer: Self-pay | Admitting: Family Medicine

## 2019-11-24 ENCOUNTER — Encounter: Payer: Self-pay | Admitting: Family Medicine

## 2019-11-24 DIAGNOSIS — D649 Anemia, unspecified: Secondary | ICD-10-CM | POA: Diagnosis not present

## 2019-11-24 DIAGNOSIS — R899 Unspecified abnormal finding in specimens from other organs, systems and tissues: Secondary | ICD-10-CM | POA: Diagnosis not present

## 2019-11-24 LAB — CYTOLOGY - PAP
Chlamydia: NEGATIVE
Comment: NEGATIVE
Comment: NEGATIVE
Comment: NORMAL
Diagnosis: NEGATIVE
High risk HPV: NEGATIVE
Neisseria Gonorrhea: NEGATIVE

## 2019-11-24 NOTE — Telephone Encounter (Signed)
err

## 2019-11-26 NOTE — Telephone Encounter (Signed)
Letter put up front for mailing. Karen Bullock, CMA

## 2019-11-26 NOTE — Telephone Encounter (Signed)
LVM for a return call to our office. If pt calls, please have her speak to a [facility] team CMA. I will also mail a copy of the results for pt to review. Ottis Stain, CMA

## 2019-11-26 NOTE — Telephone Encounter (Signed)
LVM using [employer]. [phone]). Ottis Stain, CMA

## 2019-11-29 DIAGNOSIS — D649 Anemia, unspecified: Secondary | ICD-10-CM | POA: Diagnosis not present

## 2019-11-29 DIAGNOSIS — R899 Unspecified abnormal finding in specimens from other organs, systems and tissues: Secondary | ICD-10-CM | POA: Diagnosis not present

## 2019-11-30 DIAGNOSIS — D469 Myelodysplastic syndrome, unspecified: Secondary | ICD-10-CM | POA: Diagnosis not present

## 2019-12-02 ENCOUNTER — Encounter (HOSPITAL_BASED_OUTPATIENT_CLINIC_OR_DEPARTMENT_OTHER): Payer: Self-pay

## 2019-12-02 ENCOUNTER — Emergency Department (HOSPITAL_BASED_OUTPATIENT_CLINIC_OR_DEPARTMENT_OTHER)
Admission: EM | Admit: 2019-12-02 | Discharge: 2019-12-02 | Disposition: A | Discharge status: Home / Self Care | Payer: BC Managed Care – PPO | Attending: Emergency Medicine | Admitting: Emergency Medicine

## 2019-12-02 ENCOUNTER — Other Ambulatory Visit: Payer: Self-pay

## 2019-12-02 DIAGNOSIS — B002 Herpesviral gingivostomatitis and pharyngotonsillitis: Secondary | ICD-10-CM | POA: Insufficient documentation

## 2019-12-02 DIAGNOSIS — R220 Localized swelling, mass and lump, head: Secondary | ICD-10-CM | POA: Diagnosis not present

## 2019-12-02 DIAGNOSIS — Z853 Personal history of malignant neoplasm of breast: Secondary | ICD-10-CM | POA: Diagnosis not present

## 2019-12-02 DIAGNOSIS — K0510 Chronic gingivitis, plaque induced: Secondary | ICD-10-CM | POA: Diagnosis not present

## 2019-12-02 MED ORDER — FLUCONAZOLE 200 MG PO TABS
200.0000 mg | ORAL_TABLET | Freq: Once | ORAL | Status: DC
  Filled 2019-12-02: qty 1

## 2019-12-02 MED ORDER — FLUCONAZOLE 150 MG PO TABS
150.0000 mg | ORAL_TABLET | Freq: Once | ORAL | Status: AC
  Administered 2019-12-02: 150 mg via ORAL

## 2019-12-02 MED ORDER — FLUCONAZOLE 150 MG PO TABS
ORAL_TABLET | ORAL | Status: AC
  Filled 2019-12-02: qty 1

## 2019-12-02 MED ORDER — ACYCLOVIR 200 MG PO CAPS
800.0000 mg | ORAL_CAPSULE | Freq: Once | ORAL | Status: AC
  Administered 2019-12-02: 800 mg via ORAL
  Filled 2019-12-02: qty 4

## 2019-12-02 MED ORDER — LEVOFLOXACIN 500 MG PO TABS
500.0000 mg | ORAL_TABLET | Freq: Once | ORAL | Status: AC
  Administered 2019-12-02: 500 mg via ORAL
  Filled 2019-12-02: qty 1

## 2019-12-02 NOTE — ED Triage Notes (Signed)
Per adult daughter/interpreter-pt with swelling to face/lips x 4 days-sores to bottom lip x 3 days-NAD-steady gait

## 2019-12-02 NOTE — ED Provider Notes (Signed)
[facility] EMERGENCY DEPARTMENT Provider Note   CSN: [id number] Arrival date & time: 12/02/19  1539     History Chief Complaint  Patient presents with  . Oral Swelling    Karen Bullock is a 54 y.o. female with recent diagnosis of myelodysplastic syndrome (scheduled to start oral chemotherapy on Monday) who presents to the ED today complaint of swelling and pain with oral lesions to her right upper and lower lip x 4-5 days. Pt reports that she woke up with the swelling and it has progressively gotten worse. She states when she wakes up in the morning her lips are very dry and stuck together. She has to peel them apart which causes some bleeding. Daughter mentions that she saw her oncologist earlier this week and they discussed the swelling and lesions - she thought they sent medications for them however is not sure. Pt is not taking any medications for it currently. She denies any new medicines prior to symptoms beginning. No new foods, detergents, cosmetic products as well. Denies fevers, chills.   Office visit on 11/23 3) ID # Herpes gingivostomatitis  - Start acyclovir 400 mg 3 times daily  # Prophylaxis - We discussed the risks of neutropenic fever - Prophylactic antimicrobials are indicated - Prophylactic antimicrobials include levofloxacin 500 mg PO daily, fluconazole, acyclovir (treatment dose given herpes stomatitis as above) - Flu shot - will give if not administered already - COVID precautions were discussed   HPI     Past Medical History:  Diagnosis Date  . Breast cancer ([facility])    Right Breast Cancer  . Breast cancer, right breast ([facility]) 01/15/2008  . Cancer ([facility])   . Dislocation of left shoulder joint   . Personal history of chemotherapy 2010   Right Breast Cancer  . Personal history of radiation therapy 2010   Right Breast Cancer    Patient Active Problem List   Diagnosis Date Noted  . Cervical cancer screening 11/23/2019  . Abnormal  laboratory test result 11/14/2019  . Weight loss, unintentional 11/08/2019  . Dysuria 10/07/2014  . Chest pain 05/26/2014  . Syncope and collapse 12/23/2012  . Breast cancer of lower-inner quadrant of right female breast ([facility]) 10/15/2012  . Preventive measure 04/04/2011    Past Surgical History:  Procedure Laterality Date  . BREAST LUMPECTOMY Right 2010  . BREAST SURGERY  2010   lumpectomy  . CHOLECYSTECTOMY N/A 05/19/2019   Procedure: LAPAROSCOPIC CHOLECYSTECTOMY WITH INTRAOPERATIVE CHOLANGIOGRAM;  Surgeon: Kinsinger, Arta Bruce, MD;  Location: [facility];  Service: General;  Laterality: N/A;     OB History   No obstetric history on file.     Family History  Problem Relation Age of Onset  . Diabetes Father   . Cancer Neg Hx   . Hypertension Neg Hx   . Heart disease Neg Hx   . Breast cancer Neg Hx     Social History   Tobacco Use  . Smoking status: Never Smoker  . Smokeless tobacco: Never Used  Vaping Use  . Vaping Use: Never used  Substance Use Topics  . Alcohol use: No  . Drug use: Yes    Home Medications Prior to Admission medications   Medication Sig Start Date End Date Taking? Authorizing Provider  acetaminophen (TYLENOL) 325 MG tablet Take 650 mg by mouth every 6 (six) hours as needed for mild pain, fever or headache.    [provider]  acetaminophen (TYLENOL) 500 MG tablet Take 2 tablets (1,000 mg total)  by mouth every 6 (six) hours. 05/20/19   Saverio Danker, PA-C  b complex vitamins tablet Take 1 tablet by mouth daily.    [provider]  ferrous sulfate 324 (65 Fe) MG TBEC Take 1 tablet daily 11/05/19   Carollee Leitz, MD  polyethylene glycol powder (GLYCOLAX/MIRALAX) 17 GM/SCOOP powder Take 17 g by mouth 2 (two) times daily as needed. 11/05/19   Carollee Leitz, MD    Allergies    Pork-derived products  Review of Systems   Review of Systems  Constitutional: Negative for chills and fever.  HENT: Positive for mouth sores.     Physical  Exam Updated Vital Signs BP 127/72 (BP Location: Left Arm)   Pulse 94   Temp 97.8 F (36.6 C) (Tympanic)   Resp 18   Ht 5\' 5"  (1.651 m)   Wt 62.1 kg   LMP 07/08/2011   BMI 22.80 kg/m   Physical Exam Vitals and nursing note reviewed.  Constitutional:      Appearance: She is not ill-appearing.  HENT:     Head: Normocephalic and atraumatic.     Mouth/Throat:     Mouth: Mucous membranes are moist.     Pharynx: No oropharyngeal exudate or posterior oropharyngeal erythema.     Comments: Mild swelling noted to both lips (worse to bottom lip compared to upper lip). 2 herpetic lesions to the right side of the upper and lower lip  Eyes:     Conjunctiva/sclera: Conjunctivae normal.  Cardiovascular:     Rate and Rhythm: Normal rate and regular rhythm.  Pulmonary:     Effort: Pulmonary effort is normal.     Breath sounds: Normal breath sounds. No wheezing, rhonchi or rales.  Skin:    General: Skin is warm and dry.     Coloration: Skin is not jaundiced.  Neurological:     Mental Status: She is alert.     ED Results / Procedures / Treatments   Labs (all labs ordered are listed, but only abnormal results are displayed) Labs Reviewed - No data to display  EKG None  Radiology No results found.  Procedures Procedures (including critical care time)  Medications Ordered in ED Medications  fluconazole (DIFLUCAN) 150 MG tablet (has no administration in time range)  acyclovir (ZOVIRAX) 200 MG capsule 800 mg (800 mg Oral Given 12/02/19 1702)  levofloxacin (LEVAQUIN) tablet 500 mg (500 mg Oral Given 12/02/19 1702)  fluconazole (DIFLUCAN) tablet 150 mg (150 mg Oral Given 12/02/19 1702)    ED Course  I have reviewed the triage vital signs and the nursing notes.  Pertinent labs & imaging results that were available during my care of the patient were reviewed by me and considered in my medical decision making (see chart for details).    MDM Rules/Calculators/A&P                           54 year old female who presents to the ED today with complaint of swelling and lesions to lips x 4-5 days. She was recently diagnosed with myelodysplastic syndrome and is scheduled to start oral chemo on Monday. Has not started it yet. No new meds, detergents, foods, hygiene products prior to lip swelling beginning. It does appear she saw her oncologist 2 days ago with these findings and diagnosed with herpes gingivostomatitis; she was prescribed acyclovir and prophylactic levaquin and fluconazole. It appears that there was a miscommunication and the medication was never picked up. Have  talked with pt and daughter about options of providing dose of medication here for today and to have them pick up medication tomorrow vs given pt a prescription for a few days and have her pick up the medication later at her pharmacy. They have chosen to get a single dose here today and pick up the medication tomorrow. They are in agreement with plan and stable for discharge home.   This note was prepared using Dragon voice recognition software and may include unintentional dictation errors due to the inherent limitations of voice recognition software.  Final Clinical Impression(s) / ED Diagnoses Final diagnoses:  Herpes gingivostomatitis    Rx / DC Orders ED Discharge Orders    None       Discharge Instructions     Please pick up medication at your pharmacy tomorrow that was sent by your oncologist and take as prescribed       Eustaquio Maize, PA-C 12/02/19 1712    Little, Wenda Overland, MD 12/02/19 1904

## 2019-12-02 NOTE — ED Notes (Signed)
ED Provider at bedside. 

## 2019-12-02 NOTE — Discharge Instructions (Addendum)
Please pick up medication at your pharmacy tomorrow that was sent by your oncologist and take as prescribed

## 2019-12-06 DIAGNOSIS — Z79899 Other long term (current) drug therapy: Secondary | ICD-10-CM | POA: Diagnosis not present

## 2019-12-06 DIAGNOSIS — D469 Myelodysplastic syndrome, unspecified: Secondary | ICD-10-CM | POA: Diagnosis not present

## 2019-12-07 DIAGNOSIS — Z79899 Other long term (current) drug therapy: Secondary | ICD-10-CM | POA: Diagnosis not present

## 2019-12-07 DIAGNOSIS — D469 Myelodysplastic syndrome, unspecified: Secondary | ICD-10-CM | POA: Diagnosis not present

## 2019-12-08 DIAGNOSIS — D469 Myelodysplastic syndrome, unspecified: Secondary | ICD-10-CM | POA: Diagnosis not present

## 2019-12-08 DIAGNOSIS — Z79899 Other long term (current) drug therapy: Secondary | ICD-10-CM | POA: Diagnosis not present

## 2019-12-09 DIAGNOSIS — Z79899 Other long term (current) drug therapy: Secondary | ICD-10-CM | POA: Diagnosis not present

## 2019-12-09 DIAGNOSIS — D469 Myelodysplastic syndrome, unspecified: Secondary | ICD-10-CM | POA: Diagnosis not present

## 2019-12-10 DIAGNOSIS — R5381 Other malaise: Secondary | ICD-10-CM | POA: Diagnosis not present

## 2019-12-10 DIAGNOSIS — G35 Multiple sclerosis: Secondary | ICD-10-CM | POA: Diagnosis not present

## 2019-12-10 DIAGNOSIS — Z79899 Other long term (current) drug therapy: Secondary | ICD-10-CM | POA: Diagnosis not present

## 2019-12-10 DIAGNOSIS — Z853 Personal history of malignant neoplasm of breast: Secondary | ICD-10-CM | POA: Diagnosis not present

## 2019-12-10 DIAGNOSIS — D46Z Other myelodysplastic syndromes: Secondary | ICD-10-CM | POA: Diagnosis not present

## 2019-12-10 DIAGNOSIS — M549 Dorsalgia, unspecified: Secondary | ICD-10-CM | POA: Diagnosis not present

## 2019-12-10 DIAGNOSIS — D469 Myelodysplastic syndrome, unspecified: Secondary | ICD-10-CM | POA: Diagnosis not present

## 2019-12-10 DIAGNOSIS — K0510 Chronic gingivitis, plaque induced: Secondary | ICD-10-CM | POA: Diagnosis not present

## 2019-12-10 DIAGNOSIS — Z7952 Long term (current) use of systemic steroids: Secondary | ICD-10-CM | POA: Diagnosis not present

## 2019-12-11 DIAGNOSIS — D469 Myelodysplastic syndrome, unspecified: Secondary | ICD-10-CM | POA: Diagnosis not present

## 2019-12-11 DIAGNOSIS — Z79899 Other long term (current) drug therapy: Secondary | ICD-10-CM | POA: Diagnosis not present

## 2019-12-13 DIAGNOSIS — D469 Myelodysplastic syndrome, unspecified: Secondary | ICD-10-CM | POA: Diagnosis not present

## 2019-12-13 DIAGNOSIS — Z79899 Other long term (current) drug therapy: Secondary | ICD-10-CM | POA: Diagnosis not present

## 2019-12-14 ENCOUNTER — Ambulatory Visit
Admission: RE | Admit: 2019-12-14 | Discharge: 2019-12-14 | Disposition: A | Discharge status: Home / Self Care | Payer: BC Managed Care – PPO | Source: Ambulatory Visit | Attending: Oncology | Admitting: Oncology

## 2019-12-14 ENCOUNTER — Other Ambulatory Visit: Payer: Self-pay

## 2019-12-14 DIAGNOSIS — Z1231 Encounter for screening mammogram for malignant neoplasm of breast: Secondary | ICD-10-CM

## 2019-12-17 DIAGNOSIS — D469 Myelodysplastic syndrome, unspecified: Secondary | ICD-10-CM | POA: Diagnosis not present

## 2019-12-21 DIAGNOSIS — D469 Myelodysplastic syndrome, unspecified: Secondary | ICD-10-CM | POA: Diagnosis not present

## 2019-12-24 DIAGNOSIS — D469 Myelodysplastic syndrome, unspecified: Secondary | ICD-10-CM | POA: Diagnosis not present

## 2019-12-28 DIAGNOSIS — D469 Myelodysplastic syndrome, unspecified: Secondary | ICD-10-CM | POA: Diagnosis not present

## 2020-01-04 DIAGNOSIS — D469 Myelodysplastic syndrome, unspecified: Secondary | ICD-10-CM | POA: Diagnosis not present

## 2020-01-04 DIAGNOSIS — Z171 Estrogen receptor negative status [ER-]: Secondary | ICD-10-CM | POA: Diagnosis not present

## 2020-01-04 DIAGNOSIS — M549 Dorsalgia, unspecified: Secondary | ICD-10-CM | POA: Diagnosis not present

## 2020-01-04 DIAGNOSIS — K1230 Oral mucositis (ulcerative), unspecified: Secondary | ICD-10-CM | POA: Diagnosis not present

## 2020-01-04 DIAGNOSIS — Z79899 Other long term (current) drug therapy: Secondary | ICD-10-CM | POA: Diagnosis not present

## 2020-01-04 DIAGNOSIS — Z853 Personal history of malignant neoplasm of breast: Secondary | ICD-10-CM | POA: Diagnosis not present

## 2020-01-04 DIAGNOSIS — G35 Multiple sclerosis: Secondary | ICD-10-CM | POA: Diagnosis not present

## 2020-01-05 DIAGNOSIS — D469 Myelodysplastic syndrome, unspecified: Secondary | ICD-10-CM | POA: Diagnosis not present

## 2020-01-10 DIAGNOSIS — D469 Myelodysplastic syndrome, unspecified: Secondary | ICD-10-CM | POA: Diagnosis not present

## 2020-01-11 DIAGNOSIS — D469 Myelodysplastic syndrome, unspecified: Secondary | ICD-10-CM | POA: Diagnosis not present

## 2020-01-12 DIAGNOSIS — D469 Myelodysplastic syndrome, unspecified: Secondary | ICD-10-CM | POA: Diagnosis not present

## 2020-01-12 DIAGNOSIS — Z79899 Other long term (current) drug therapy: Secondary | ICD-10-CM | POA: Diagnosis not present

## 2020-01-13 DIAGNOSIS — D469 Myelodysplastic syndrome, unspecified: Secondary | ICD-10-CM | POA: Diagnosis not present

## 2020-01-13 DIAGNOSIS — Z79899 Other long term (current) drug therapy: Secondary | ICD-10-CM | POA: Diagnosis not present

## 2020-01-14 DIAGNOSIS — Z79899 Other long term (current) drug therapy: Secondary | ICD-10-CM | POA: Diagnosis not present

## 2020-01-14 DIAGNOSIS — D469 Myelodysplastic syndrome, unspecified: Secondary | ICD-10-CM | POA: Diagnosis not present

## 2020-01-17 DIAGNOSIS — Z79899 Other long term (current) drug therapy: Secondary | ICD-10-CM | POA: Diagnosis not present

## 2020-01-17 DIAGNOSIS — D469 Myelodysplastic syndrome, unspecified: Secondary | ICD-10-CM | POA: Diagnosis not present

## 2020-01-18 DIAGNOSIS — D469 Myelodysplastic syndrome, unspecified: Secondary | ICD-10-CM | POA: Diagnosis not present

## 2020-01-18 DIAGNOSIS — Z79899 Other long term (current) drug therapy: Secondary | ICD-10-CM | POA: Diagnosis not present

## 2020-01-21 DIAGNOSIS — Z853 Personal history of malignant neoplasm of breast: Secondary | ICD-10-CM | POA: Diagnosis not present

## 2020-01-21 DIAGNOSIS — D469 Myelodysplastic syndrome, unspecified: Secondary | ICD-10-CM | POA: Diagnosis not present

## 2020-01-21 DIAGNOSIS — B002 Herpesviral gingivostomatitis and pharyngotonsillitis: Secondary | ICD-10-CM | POA: Diagnosis not present

## 2020-01-21 DIAGNOSIS — D46Z Other myelodysplastic syndromes: Secondary | ICD-10-CM | POA: Diagnosis not present

## 2020-01-21 DIAGNOSIS — Z171 Estrogen receptor negative status [ER-]: Secondary | ICD-10-CM | POA: Diagnosis not present

## 2020-02-01 DIAGNOSIS — D469 Myelodysplastic syndrome, unspecified: Secondary | ICD-10-CM | POA: Diagnosis not present

## 2020-02-08 DIAGNOSIS — D469 Myelodysplastic syndrome, unspecified: Secondary | ICD-10-CM | POA: Diagnosis not present

## 2020-02-15 DIAGNOSIS — D469 Myelodysplastic syndrome, unspecified: Secondary | ICD-10-CM | POA: Diagnosis not present

## 2020-02-15 DIAGNOSIS — C50911 Malignant neoplasm of unspecified site of right female breast: Secondary | ICD-10-CM | POA: Diagnosis not present

## 2020-02-15 DIAGNOSIS — Z171 Estrogen receptor negative status [ER-]: Secondary | ICD-10-CM | POA: Diagnosis not present

## 2020-02-15 DIAGNOSIS — B0089 Other herpesviral infection: Secondary | ICD-10-CM | POA: Diagnosis not present

## 2020-02-15 DIAGNOSIS — D46Z Other myelodysplastic syndromes: Secondary | ICD-10-CM | POA: Diagnosis not present

## 2020-02-21 DIAGNOSIS — C50311 Malignant neoplasm of lower-inner quadrant of right female breast: Secondary | ICD-10-CM | POA: Diagnosis not present

## 2020-02-21 DIAGNOSIS — D469 Myelodysplastic syndrome, unspecified: Secondary | ICD-10-CM | POA: Diagnosis not present

## 2020-02-21 DIAGNOSIS — Z171 Estrogen receptor negative status [ER-]: Secondary | ICD-10-CM | POA: Diagnosis not present

## 2020-02-21 DIAGNOSIS — Z01818 Encounter for other preprocedural examination: Secondary | ICD-10-CM | POA: Diagnosis not present

## 2020-02-21 DIAGNOSIS — Z7189 Other specified counseling: Secondary | ICD-10-CM | POA: Diagnosis not present

## 2020-02-22 DIAGNOSIS — Z833 Family history of diabetes mellitus: Secondary | ICD-10-CM | POA: Diagnosis not present

## 2020-02-22 DIAGNOSIS — D469 Myelodysplastic syndrome, unspecified: Secondary | ICD-10-CM | POA: Diagnosis not present

## 2020-02-22 DIAGNOSIS — D46Z Other myelodysplastic syndromes: Secondary | ICD-10-CM | POA: Diagnosis not present

## 2020-02-22 DIAGNOSIS — Z853 Personal history of malignant neoplasm of breast: Secondary | ICD-10-CM | POA: Diagnosis not present

## 2020-02-22 DIAGNOSIS — K1230 Oral mucositis (ulcerative), unspecified: Secondary | ICD-10-CM | POA: Diagnosis not present

## 2020-02-23 DIAGNOSIS — D469 Myelodysplastic syndrome, unspecified: Secondary | ICD-10-CM | POA: Diagnosis not present

## 2020-02-24 DIAGNOSIS — D469 Myelodysplastic syndrome, unspecified: Secondary | ICD-10-CM | POA: Diagnosis not present

## 2020-02-25 DIAGNOSIS — D469 Myelodysplastic syndrome, unspecified: Secondary | ICD-10-CM | POA: Diagnosis not present

## 2020-02-28 DIAGNOSIS — D469 Myelodysplastic syndrome, unspecified: Secondary | ICD-10-CM | POA: Diagnosis not present

## 2020-02-29 DIAGNOSIS — D469 Myelodysplastic syndrome, unspecified: Secondary | ICD-10-CM | POA: Diagnosis not present

## 2020-03-01 DIAGNOSIS — D469 Myelodysplastic syndrome, unspecified: Secondary | ICD-10-CM | POA: Diagnosis not present

## 2020-03-06 DIAGNOSIS — D469 Myelodysplastic syndrome, unspecified: Secondary | ICD-10-CM | POA: Diagnosis not present

## 2020-03-10 DIAGNOSIS — Z005 Encounter for examination of potential donor of organ and tissue: Secondary | ICD-10-CM | POA: Diagnosis not present

## 2020-03-17 DIAGNOSIS — K1230 Oral mucositis (ulcerative), unspecified: Secondary | ICD-10-CM | POA: Diagnosis not present

## 2020-03-17 DIAGNOSIS — Z853 Personal history of malignant neoplasm of breast: Secondary | ICD-10-CM | POA: Diagnosis not present

## 2020-03-17 DIAGNOSIS — D469 Myelodysplastic syndrome, unspecified: Secondary | ICD-10-CM | POA: Diagnosis not present

## 2020-03-17 DIAGNOSIS — Z79899 Other long term (current) drug therapy: Secondary | ICD-10-CM | POA: Diagnosis not present

## 2020-04-03 DIAGNOSIS — Z5111 Encounter for antineoplastic chemotherapy: Secondary | ICD-10-CM | POA: Diagnosis not present

## 2020-04-03 DIAGNOSIS — D469 Myelodysplastic syndrome, unspecified: Secondary | ICD-10-CM | POA: Diagnosis not present

## 2020-04-04 DIAGNOSIS — D61810 Antineoplastic chemotherapy induced pancytopenia: Secondary | ICD-10-CM | POA: Diagnosis not present

## 2020-04-04 DIAGNOSIS — R5383 Other fatigue: Secondary | ICD-10-CM | POA: Diagnosis not present

## 2020-04-04 DIAGNOSIS — Z9049 Acquired absence of other specified parts of digestive tract: Secondary | ICD-10-CM | POA: Diagnosis not present

## 2020-04-04 DIAGNOSIS — D469 Myelodysplastic syndrome, unspecified: Secondary | ICD-10-CM | POA: Diagnosis not present

## 2020-04-04 DIAGNOSIS — Z1501 Genetic susceptibility to malignant neoplasm of breast: Secondary | ICD-10-CM | POA: Diagnosis not present

## 2020-04-04 DIAGNOSIS — Z171 Estrogen receptor negative status [ER-]: Secondary | ICD-10-CM | POA: Diagnosis not present

## 2020-04-04 DIAGNOSIS — T451X5A Adverse effect of antineoplastic and immunosuppressive drugs, initial encounter: Secondary | ICD-10-CM | POA: Diagnosis not present

## 2020-04-04 DIAGNOSIS — R5381 Other malaise: Secondary | ICD-10-CM | POA: Diagnosis not present

## 2020-04-04 DIAGNOSIS — Z833 Family history of diabetes mellitus: Secondary | ICD-10-CM | POA: Diagnosis not present

## 2020-04-04 DIAGNOSIS — C50311 Malignant neoplasm of lower-inner quadrant of right female breast: Secondary | ICD-10-CM | POA: Diagnosis not present

## 2020-04-05 DIAGNOSIS — D469 Myelodysplastic syndrome, unspecified: Secondary | ICD-10-CM | POA: Diagnosis not present

## 2020-04-06 DIAGNOSIS — D469 Myelodysplastic syndrome, unspecified: Secondary | ICD-10-CM | POA: Diagnosis not present

## 2020-04-07 DIAGNOSIS — D469 Myelodysplastic syndrome, unspecified: Secondary | ICD-10-CM | POA: Diagnosis not present

## 2020-04-07 DIAGNOSIS — Z5111 Encounter for antineoplastic chemotherapy: Secondary | ICD-10-CM | POA: Diagnosis not present

## 2020-04-10 DIAGNOSIS — D469 Myelodysplastic syndrome, unspecified: Secondary | ICD-10-CM | POA: Diagnosis not present

## 2020-04-11 DIAGNOSIS — D469 Myelodysplastic syndrome, unspecified: Secondary | ICD-10-CM | POA: Diagnosis not present

## 2020-04-25 DIAGNOSIS — Z52001 Unspecified donor, stem cells: Secondary | ICD-10-CM | POA: Diagnosis not present

## 2020-04-25 DIAGNOSIS — D469 Myelodysplastic syndrome, unspecified: Secondary | ICD-10-CM | POA: Diagnosis not present

## 2020-04-27 DIAGNOSIS — F4321 Adjustment disorder with depressed mood: Secondary | ICD-10-CM | POA: Diagnosis not present

## 2020-04-27 DIAGNOSIS — D469 Myelodysplastic syndrome, unspecified: Secondary | ICD-10-CM | POA: Diagnosis not present

## 2020-05-02 DIAGNOSIS — C50911 Malignant neoplasm of unspecified site of right female breast: Secondary | ICD-10-CM | POA: Diagnosis not present

## 2020-05-02 DIAGNOSIS — Z171 Estrogen receptor negative status [ER-]: Secondary | ICD-10-CM | POA: Diagnosis not present

## 2020-05-02 DIAGNOSIS — R5383 Other fatigue: Secondary | ICD-10-CM | POA: Diagnosis not present

## 2020-05-02 DIAGNOSIS — D469 Myelodysplastic syndrome, unspecified: Secondary | ICD-10-CM | POA: Diagnosis not present

## 2020-05-02 DIAGNOSIS — D696 Thrombocytopenia, unspecified: Secondary | ICD-10-CM | POA: Diagnosis not present

## 2020-05-02 DIAGNOSIS — R52 Pain, unspecified: Secondary | ICD-10-CM | POA: Diagnosis not present

## 2020-05-05 DIAGNOSIS — D469 Myelodysplastic syndrome, unspecified: Secondary | ICD-10-CM | POA: Diagnosis not present

## 2020-05-08 DIAGNOSIS — I639 Cerebral infarction, unspecified: Secondary | ICD-10-CM | POA: Diagnosis not present

## 2020-05-08 DIAGNOSIS — A4902 Methicillin resistant Staphylococcus aureus infection, unspecified site: Secondary | ICD-10-CM | POA: Diagnosis not present

## 2020-05-08 DIAGNOSIS — D709 Neutropenia, unspecified: Secondary | ICD-10-CM | POA: Diagnosis not present

## 2020-05-08 DIAGNOSIS — R64 Cachexia: Secondary | ICD-10-CM | POA: Diagnosis not present

## 2020-05-08 DIAGNOSIS — Z853 Personal history of malignant neoplasm of breast: Secondary | ICD-10-CM | POA: Diagnosis not present

## 2020-05-08 DIAGNOSIS — D46Z Other myelodysplastic syndromes: Secondary | ICD-10-CM | POA: Diagnosis not present

## 2020-05-08 DIAGNOSIS — A411 Sepsis due to other specified staphylococcus: Secondary | ICD-10-CM | POA: Diagnosis not present

## 2020-05-08 DIAGNOSIS — R112 Nausea with vomiting, unspecified: Secondary | ICD-10-CM | POA: Diagnosis not present

## 2020-05-08 DIAGNOSIS — R5081 Fever presenting with conditions classified elsewhere: Secondary | ICD-10-CM | POA: Diagnosis not present

## 2020-05-08 DIAGNOSIS — K6389 Other specified diseases of intestine: Secondary | ICD-10-CM | POA: Diagnosis not present

## 2020-05-08 DIAGNOSIS — E860 Dehydration: Secondary | ICD-10-CM | POA: Diagnosis not present

## 2020-05-08 DIAGNOSIS — D703 Neutropenia due to infection: Secondary | ICD-10-CM | POA: Diagnosis not present

## 2020-05-08 DIAGNOSIS — D708 Other neutropenia: Secondary | ICD-10-CM | POA: Diagnosis not present

## 2020-05-08 DIAGNOSIS — K0889 Other specified disorders of teeth and supporting structures: Secondary | ICD-10-CM | POA: Diagnosis not present

## 2020-05-08 DIAGNOSIS — Z01818 Encounter for other preprocedural examination: Secondary | ICD-10-CM | POA: Diagnosis not present

## 2020-05-08 DIAGNOSIS — T451X5A Adverse effect of antineoplastic and immunosuppressive drugs, initial encounter: Secondary | ICD-10-CM | POA: Diagnosis not present

## 2020-05-08 DIAGNOSIS — J323 Chronic sphenoidal sinusitis: Secondary | ICD-10-CM | POA: Diagnosis not present

## 2020-05-08 DIAGNOSIS — R110 Nausea: Secondary | ICD-10-CM | POA: Diagnosis not present

## 2020-05-08 DIAGNOSIS — H0100A Unspecified blepharitis right eye, upper and lower eyelids: Secondary | ICD-10-CM | POA: Diagnosis not present

## 2020-05-08 DIAGNOSIS — I6389 Other cerebral infarction: Secondary | ICD-10-CM | POA: Diagnosis not present

## 2020-05-08 DIAGNOSIS — M791 Myalgia, unspecified site: Secondary | ICD-10-CM | POA: Diagnosis not present

## 2020-05-08 DIAGNOSIS — R1312 Dysphagia, oropharyngeal phase: Secondary | ICD-10-CM | POA: Diagnosis not present

## 2020-05-08 DIAGNOSIS — E876 Hypokalemia: Secondary | ICD-10-CM | POA: Diagnosis not present

## 2020-05-08 DIAGNOSIS — R1084 Generalized abdominal pain: Secondary | ICD-10-CM | POA: Diagnosis not present

## 2020-05-08 DIAGNOSIS — J329 Chronic sinusitis, unspecified: Secondary | ICD-10-CM | POA: Diagnosis not present

## 2020-05-08 DIAGNOSIS — E874 Mixed disorder of acid-base balance: Secondary | ICD-10-CM | POA: Diagnosis not present

## 2020-05-08 DIAGNOSIS — R7989 Other specified abnormal findings of blood chemistry: Secondary | ICD-10-CM | POA: Diagnosis not present

## 2020-05-08 DIAGNOSIS — Z9221 Personal history of antineoplastic chemotherapy: Secondary | ICD-10-CM | POA: Diagnosis not present

## 2020-05-08 DIAGNOSIS — R519 Headache, unspecified: Secondary | ICD-10-CM | POA: Diagnosis not present

## 2020-05-08 DIAGNOSIS — Z9049 Acquired absence of other specified parts of digestive tract: Secondary | ICD-10-CM | POA: Diagnosis not present

## 2020-05-08 DIAGNOSIS — R059 Cough, unspecified: Secondary | ICD-10-CM | POA: Diagnosis not present

## 2020-05-08 DIAGNOSIS — Z66 Do not resuscitate: Secondary | ICD-10-CM | POA: Diagnosis not present

## 2020-05-08 DIAGNOSIS — D469 Myelodysplastic syndrome, unspecified: Secondary | ICD-10-CM | POA: Diagnosis not present

## 2020-05-08 DIAGNOSIS — B957 Other staphylococcus as the cause of diseases classified elsewhere: Secondary | ICD-10-CM | POA: Diagnosis not present

## 2020-05-08 DIAGNOSIS — B457 Disseminated cryptococcosis: Secondary | ICD-10-CM | POA: Diagnosis not present

## 2020-05-08 DIAGNOSIS — E42 Marasmic kwashiorkor: Secondary | ICD-10-CM | POA: Diagnosis not present

## 2020-05-08 DIAGNOSIS — J0130 Acute sphenoidal sinusitis, unspecified: Secondary | ICD-10-CM | POA: Diagnosis not present

## 2020-05-08 DIAGNOSIS — H0100B Unspecified blepharitis left eye, upper and lower eyelids: Secondary | ICD-10-CM | POA: Diagnosis not present

## 2020-05-08 DIAGNOSIS — R509 Fever, unspecified: Secondary | ICD-10-CM | POA: Diagnosis not present

## 2020-05-08 DIAGNOSIS — N170 Acute kidney failure with tubular necrosis: Secondary | ICD-10-CM | POA: Diagnosis not present

## 2020-05-08 DIAGNOSIS — R2231 Localized swelling, mass and lump, right upper limb: Secondary | ICD-10-CM | POA: Diagnosis not present

## 2020-05-08 DIAGNOSIS — N179 Acute kidney failure, unspecified: Secondary | ICD-10-CM | POA: Diagnosis not present

## 2020-05-08 DIAGNOSIS — R000 Tachycardia, unspecified: Secondary | ICD-10-CM | POA: Diagnosis not present

## 2020-05-08 DIAGNOSIS — D696 Thrombocytopenia, unspecified: Secondary | ICD-10-CM | POA: Diagnosis not present

## 2020-05-08 DIAGNOSIS — E872 Acidosis: Secondary | ICD-10-CM | POA: Diagnosis not present

## 2020-05-08 DIAGNOSIS — Z52001 Unspecified donor, stem cells: Secondary | ICD-10-CM | POA: Diagnosis not present

## 2020-05-08 DIAGNOSIS — B49 Unspecified mycosis: Secondary | ICD-10-CM | POA: Diagnosis not present

## 2020-05-08 DIAGNOSIS — D61810 Antineoplastic chemotherapy induced pancytopenia: Secondary | ICD-10-CM | POA: Diagnosis not present

## 2020-05-08 DIAGNOSIS — Z4659 Encounter for fitting and adjustment of other gastrointestinal appliance and device: Secondary | ICD-10-CM | POA: Diagnosis not present

## 2020-05-08 DIAGNOSIS — B9689 Other specified bacterial agents as the cause of diseases classified elsewhere: Secondary | ICD-10-CM | POA: Diagnosis not present

## 2020-05-08 DIAGNOSIS — R918 Other nonspecific abnormal finding of lung field: Secondary | ICD-10-CM | POA: Diagnosis not present

## 2020-05-08 DIAGNOSIS — R109 Unspecified abdominal pain: Secondary | ICD-10-CM | POA: Diagnosis not present

## 2020-05-08 DIAGNOSIS — C50911 Malignant neoplasm of unspecified site of right female breast: Secondary | ICD-10-CM | POA: Diagnosis not present

## 2020-05-08 DIAGNOSIS — C9200 Acute myeloblastic leukemia, not having achieved remission: Secondary | ICD-10-CM | POA: Diagnosis not present

## 2020-05-08 DIAGNOSIS — N186 End stage renal disease: Secondary | ICD-10-CM | POA: Diagnosis not present

## 2020-05-08 DIAGNOSIS — Z20822 Contact with and (suspected) exposure to covid-19: Secondary | ICD-10-CM | POA: Diagnosis not present

## 2020-05-08 DIAGNOSIS — R633 Feeding difficulties, unspecified: Secondary | ICD-10-CM | POA: Diagnosis not present

## 2020-05-08 DIAGNOSIS — E43 Unspecified severe protein-calorie malnutrition: Secondary | ICD-10-CM | POA: Diagnosis not present

## 2020-05-08 DIAGNOSIS — Z01810 Encounter for preprocedural cardiovascular examination: Secondary | ICD-10-CM | POA: Diagnosis not present

## 2020-05-08 DIAGNOSIS — D61818 Other pancytopenia: Secondary | ICD-10-CM | POA: Diagnosis not present

## 2020-05-08 DIAGNOSIS — I493 Ventricular premature depolarization: Secondary | ICD-10-CM | POA: Diagnosis not present

## 2020-05-08 DIAGNOSIS — C50311 Malignant neoplasm of lower-inner quadrant of right female breast: Secondary | ICD-10-CM | POA: Diagnosis not present

## 2020-05-09 DIAGNOSIS — Z52001 Unspecified donor, stem cells: Secondary | ICD-10-CM | POA: Diagnosis not present

## 2020-07-07 DEATH — deceased

## 2022-06-22 IMAGING — MG DIGITAL SCREENING BILAT W/ TOMO W/ CAD
8 series · 9 of 24 positions shown · non-contrast
Comparison: Previous exam(s).

CLINICAL DATA: Screening.

EXAM:
DIGITAL SCREENING BILATERAL MAMMOGRAM WITH TOMO AND CAD

[R CC synth-2D]
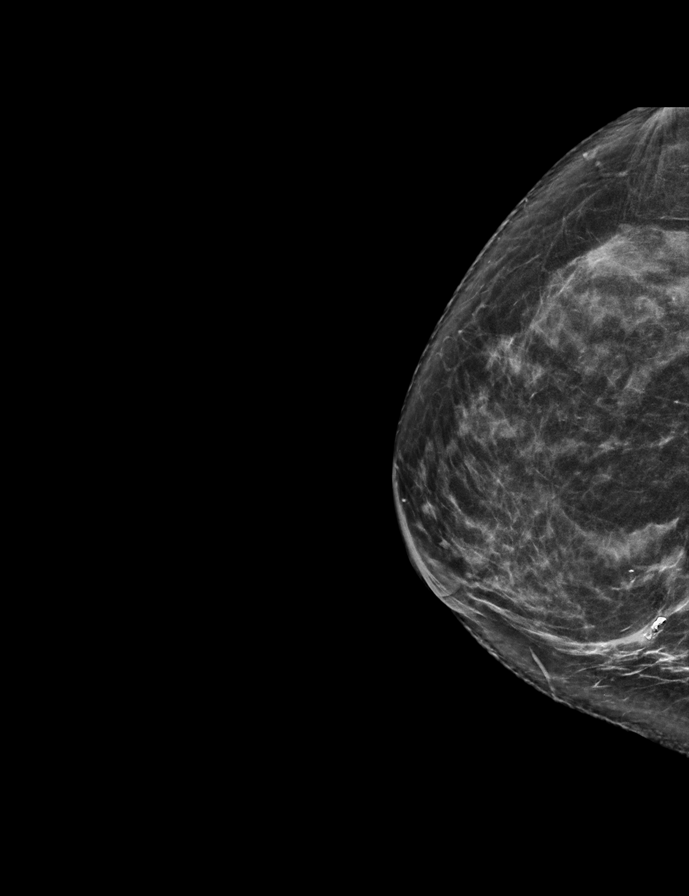

[L CC synth-2D]
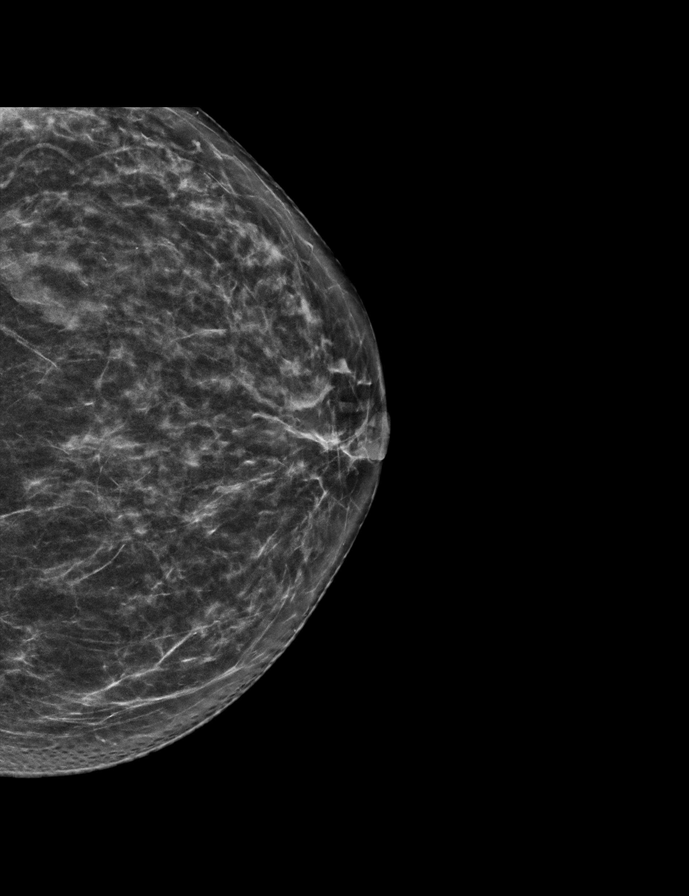

[R MLO synth-2D]
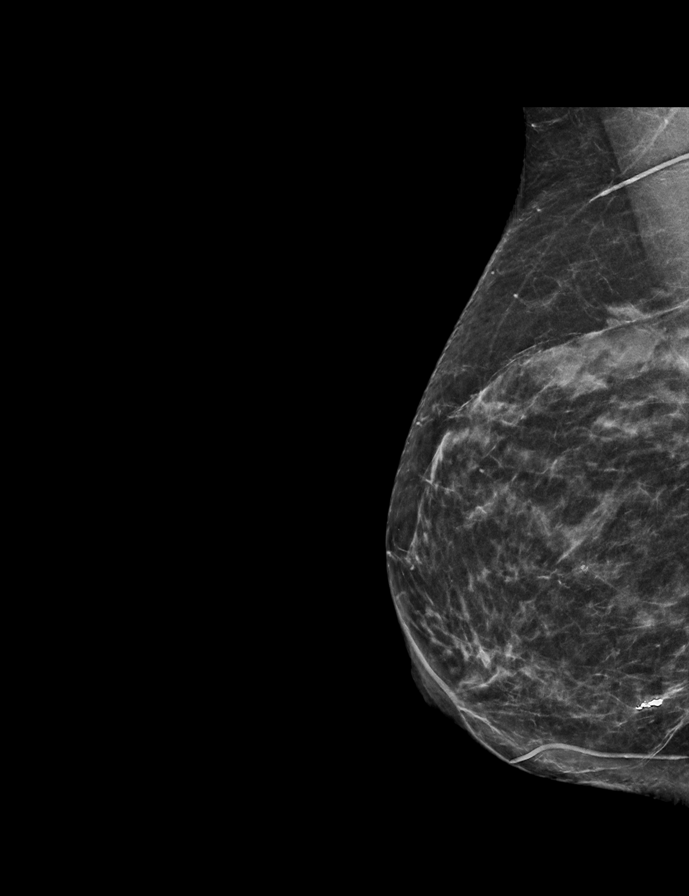

[L MLO synth-2D]
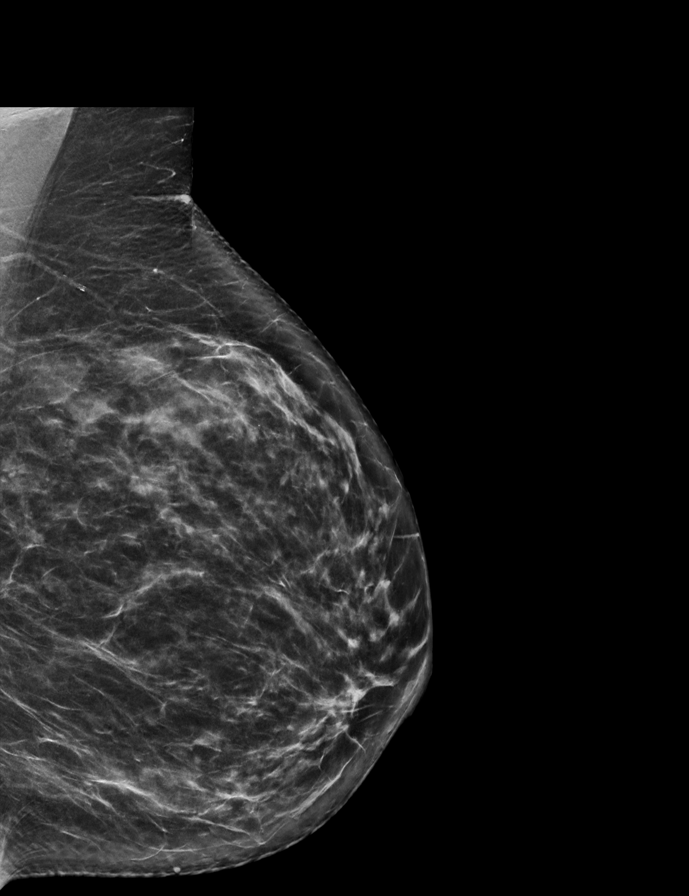

[R CC tomo · 2 of 60 frames shown]
[frame 20/60]
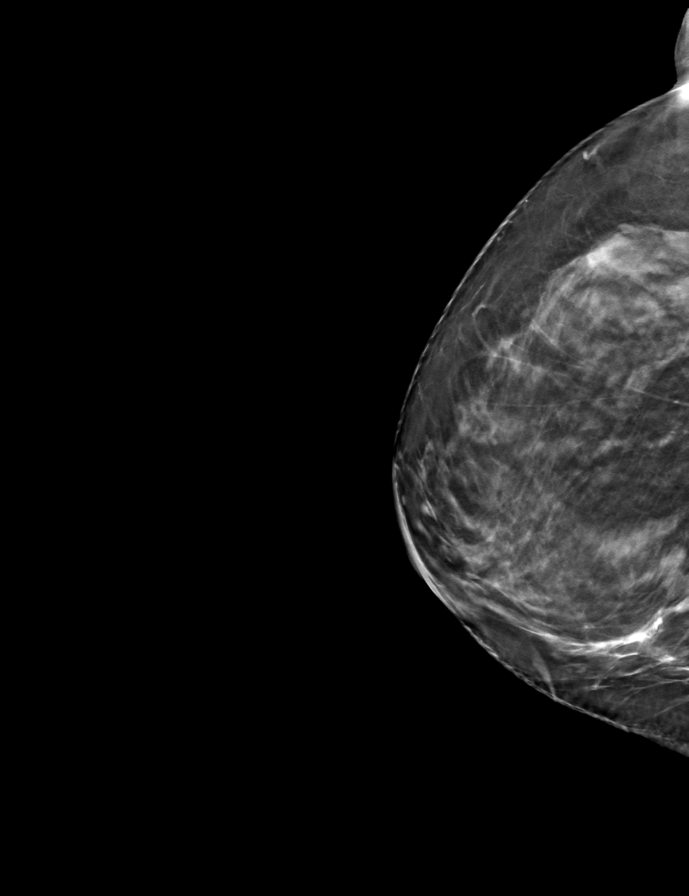
[frame 31/60]
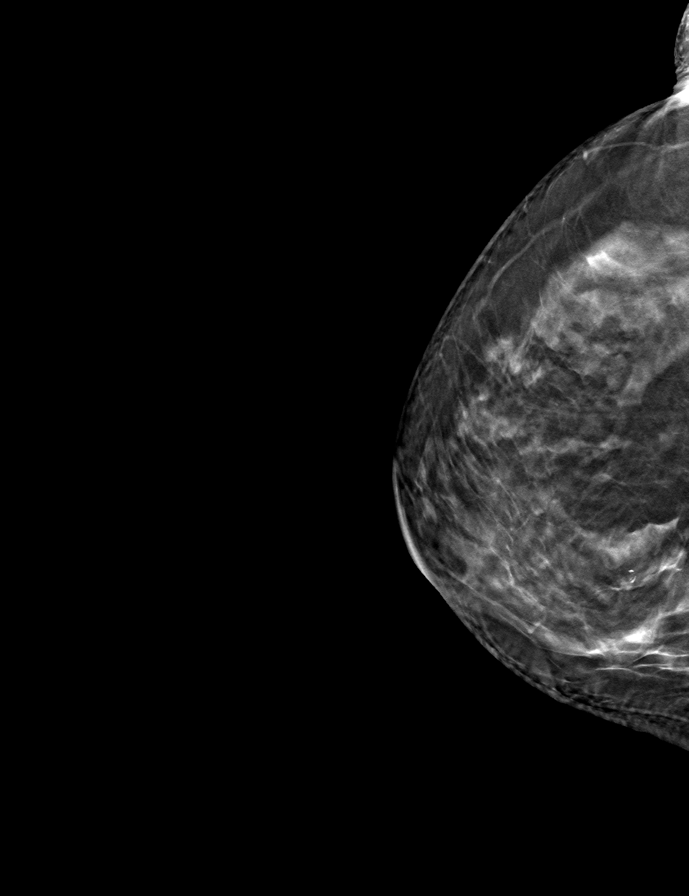

[L CC tomo · tomo slice 31/61.0]
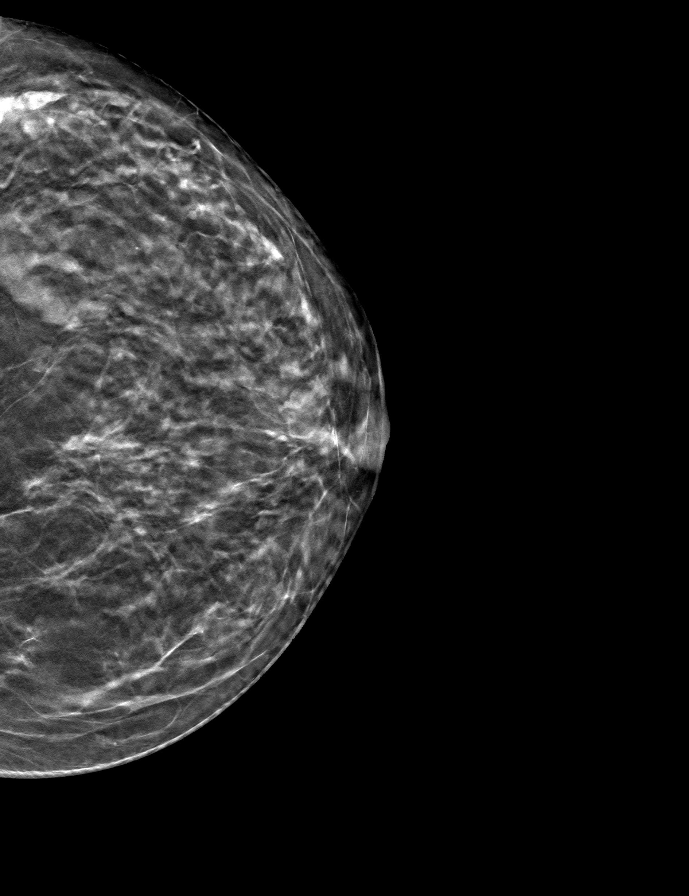

[L MLO tomo · tomo slice 35/68.0]
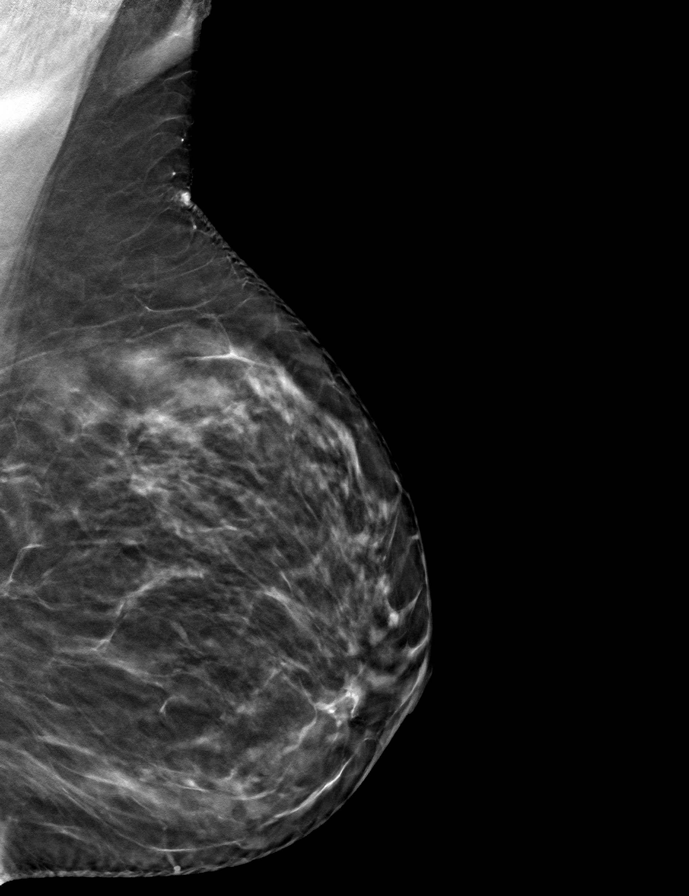

[R MLO tomo · tomo slice 29/57.0]
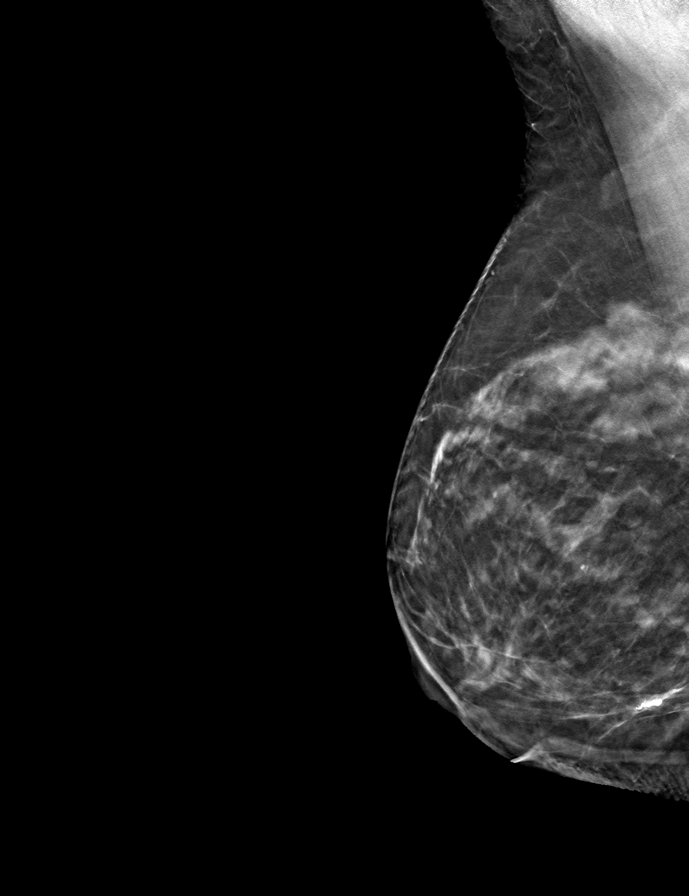

[9 of 24 positions shown; findings below may reference images not displayed]

ACR Breast Density Category b: There are scattered areas of
fibroglandular density.
FINDINGS: There are no findings suspicious for malignancy. Images were
processed with CAD.
IMPRESSION: No mammographic evidence of malignancy. A result letter of this
screening mammogram will be mailed directly to the patient.

RECOMMENDATION:
Screening mammogram in one year. (Code:[id number])

BI-RADS CATEGORY  1: Negative.
